# Patient Record
Sex: Female | Born: 1975
Health system: Southern US, Community
[De-identification: ages and names within clinical notes are randomized; demographics above are authoritative.]

## PROBLEM LIST (undated history)

## (undated) DIAGNOSIS — F410 Panic disorder [episodic paroxysmal anxiety] without agoraphobia: Secondary | ICD-10-CM

## (undated) HISTORY — DX: Panic disorder (episodic paroxysmal anxiety): F41.0

---

## 1997-10-09 ENCOUNTER — Other Ambulatory Visit: Admission: RE | Admit: 1997-10-09 | Discharge: 1997-10-09 | Payer: Self-pay | Admitting: Obstetrics and Gynecology

## 1998-04-11 ENCOUNTER — Inpatient Hospital Stay (HOSPITAL_COMMUNITY): Admission: AD | Admit: 1998-04-11 | Discharge: 1998-04-16 | Payer: Self-pay | Admitting: Obstetrics and Gynecology

## 1998-05-14 ENCOUNTER — Other Ambulatory Visit: Admission: RE | Admit: 1998-05-14 | Discharge: 1998-05-14 | Payer: Self-pay | Admitting: Obstetrics and Gynecology

## 1999-04-14 DIAGNOSIS — F410 Panic disorder [episodic paroxysmal anxiety] without agoraphobia: Secondary | ICD-10-CM

## 1999-04-14 HISTORY — DX: Panic disorder (episodic paroxysmal anxiety): F41.0

## 1999-06-18 ENCOUNTER — Other Ambulatory Visit: Admission: RE | Admit: 1999-06-18 | Discharge: 1999-06-18 | Payer: Self-pay | Admitting: Obstetrics and Gynecology

## 2000-06-30 ENCOUNTER — Other Ambulatory Visit: Admission: RE | Admit: 2000-06-30 | Discharge: 2000-06-30 | Payer: Self-pay | Admitting: Obstetrics and Gynecology

## 2001-01-26 ENCOUNTER — Other Ambulatory Visit: Admission: RE | Admit: 2001-01-26 | Discharge: 2001-01-26 | Payer: Self-pay | Admitting: Obstetrics and Gynecology

## 2001-09-02 ENCOUNTER — Inpatient Hospital Stay (HOSPITAL_COMMUNITY): Admission: AD | Admit: 2001-09-02 | Discharge: 2001-09-05 | Payer: Self-pay | Admitting: Obstetrics and Gynecology

## 2001-10-05 ENCOUNTER — Other Ambulatory Visit: Admission: RE | Admit: 2001-10-05 | Discharge: 2001-10-05 | Payer: Self-pay | Admitting: Obstetrics and Gynecology

## 2002-10-09 ENCOUNTER — Other Ambulatory Visit: Admission: RE | Admit: 2002-10-09 | Discharge: 2002-10-09 | Payer: Self-pay | Admitting: Obstetrics and Gynecology

## 2006-04-13 HISTORY — PX: CHOLECYSTECTOMY: SHX55

## 2008-06-11 ENCOUNTER — Ambulatory Visit: Payer: Self-pay | Admitting: Internal Medicine

## 2008-07-09 ENCOUNTER — Ambulatory Visit: Payer: Self-pay | Admitting: General Surgery

## 2008-07-26 ENCOUNTER — Ambulatory Visit: Payer: Self-pay | Admitting: General Surgery

## 2012-09-28 ENCOUNTER — Ambulatory Visit: Payer: Self-pay | Admitting: Internal Medicine

## 2012-12-01 ENCOUNTER — Ambulatory Visit (INDEPENDENT_AMBULATORY_CARE_PROVIDER_SITE_OTHER): Payer: 59 | Admitting: Internal Medicine

## 2012-12-01 ENCOUNTER — Encounter: Payer: Self-pay | Admitting: Internal Medicine

## 2012-12-01 VITALS — BP 152/90 | HR 77 | Temp 97.8°F | Resp 14 | Ht 66.25 in | Wt 180.8 lb

## 2012-12-01 DIAGNOSIS — IMO0001 Reserved for inherently not codable concepts without codable children: Secondary | ICD-10-CM

## 2012-12-01 DIAGNOSIS — I10 Essential (primary) hypertension: Secondary | ICD-10-CM | POA: Insufficient documentation

## 2012-12-01 DIAGNOSIS — F4310 Post-traumatic stress disorder, unspecified: Secondary | ICD-10-CM

## 2012-12-01 DIAGNOSIS — Z6825 Body mass index (BMI) 25.0-25.9, adult: Secondary | ICD-10-CM

## 2012-12-01 DIAGNOSIS — Z789 Other specified health status: Secondary | ICD-10-CM

## 2012-12-01 DIAGNOSIS — E669 Obesity, unspecified: Secondary | ICD-10-CM | POA: Insufficient documentation

## 2012-12-01 DIAGNOSIS — E663 Overweight: Secondary | ICD-10-CM

## 2012-12-01 DIAGNOSIS — F411 Generalized anxiety disorder: Secondary | ICD-10-CM

## 2012-12-01 DIAGNOSIS — R030 Elevated blood-pressure reading, without diagnosis of hypertension: Secondary | ICD-10-CM | POA: Insufficient documentation

## 2012-12-01 MED ORDER — ALPRAZOLAM 0.25 MG PO TABS: 0.2500 mg | ORAL_TABLET | Freq: Every evening | ORAL | Status: DC | PRN

## 2012-12-01 MED ORDER — PAROXETINE HCL 20 MG PO TABS: 20.0000 mg | ORAL_TABLET | Freq: Every day | ORAL | Status: DC

## 2012-12-01 NOTE — Progress Notes (Signed)
Patient ID: Christine Monroe, female   DOB: 08/29/1975, 37 y.o.   MRN: [id number]   Patient Active Problem List   Diagnosis Date Noted  . No history of unexplained stillbirth, greater than 3 spontaneous abortions, premature delivery with preeclampsia, or intrauterine growth retarted (IUGR) infant 12/01/2012  . Generalized anxiety disorder 12/01/2012  . Overweight (BMI 25.0-29.9) 12/01/2012    Subjective:  CC:   Chief Complaint  Patient presents with  . Establish Care    HPI:   Christine Monroe is a 37 y.o. female who presents as a new patient to establish primary care with the chief complaint of anxiety . She is transferring care from [facility].  Her anxiety has been aggravated by the recent initiation of phentermine which was prescribed  by her gynecologist Dr.  Rosemary Holms during her annual exam..  She has a history of generalized anxiety with panic disorder which started in 09-11-1999 when her mother died collapsed at work and die of a brain aneurysm.  She has not been on SSRI therapy for over a year. She recently started taking Paxil again after she developed symptoms of uncontrolled anxiety. She has been using alprazolam prn for panic attacks.  Overweight:  She has had recent  fasting labs done at work  (works for [employer] in [employer] )  .   PMH:no  prior hospitalizations.  She is s/p cholecystectomy in Sep 11, 2006 by Adela Glimpse with postoperative diarrhea which has resolved.  No bowel issues.  No insomnia.   her menses are regular in length and interval. She does not use birth control because her husband has had a vasectomy     Past Medical History  Diagnosis Date  . Panic attacks 11-Sep-1999    since mother death    Past Surgical History  Procedure Laterality Date  . Cholecystectomy  Sep 11, 2006    Family History  Problem Relation Age of Onset  . Cancer Father   . Hypertension Father   . Aneurysm Mother 60    2001    History   Social History  . Marital Status: Married    Spouse Name: N/A   Number of Children: N/A  . Years of Education: N/A   Occupational History  . Not on file.   Social History Main Topics  . Smoking status: Never Smoker   . Smokeless tobacco: Never Used  . Alcohol Use: No  . Drug Use: No  . Sexual Activity: Yes   Other Topics Concern  . Not on file   Social History Narrative  . No narrative on file     No Known Allergies   Review of Systems:   The remainder of the review of systems was negative except those addressed in the HPI.       Objective:  BP 152/90  Pulse 77  Temp(Src) 97.8 F (36.6 C) (Oral)  Resp 14  Ht 5' 6.25" (1.683 m)  Wt 180 lb 12 oz (81.988 kg)  BMI 28.95 kg/m2  SpO2 98%  LMP 11/13/2012  General appearance: alert, cooperative and appears stated age Ears: normal TM's and external ear canals both ears Throat: lips, mucosa, and tongue normal; teeth and gums normal Neck: no adenopathy, no carotid bruit, supple, symmetrical, trachea midline and thyroid not enlarged, symmetric, no tenderness/mass/nodules Back: symmetric, no curvature. ROM normal. No CVA tenderness. Lungs: clear to auscultation bilaterally Heart: regular rate and rhythm, S1, S2 normal, no murmur, click, rub or gallop Abdomen: soft, non-tender; bowel sounds normal; no masses,  no  organomegaly Pulses: 2+ and symmetric Skin: Skin color, texture, turgor normal. No rashes or lesions Lymph nodes: Cervical, supraclavicular, and axillary nodes normal.  Assessment and Plan:  Generalized anxiety disorder Resume Paxil at 20 mg daily. Suggested that she suspend phentermine until her anxiety is under better control. Continue when necessary alprazolam.  No history of unexplained stillbirth, greater than 3 spontaneous abortions, premature delivery with preeclampsia, or intrauterine growth retarted (IUGR) infant Her blood pressure is elevated today and she has a history of preeclampsia with hypertension only during her last pregnancy. I've asked her to get her  blood pressure checked several times over the next 2 weeks before resuming phentermine.  Overweight (BMI 25.0-29.9) I have addressed  BMI and recommended a low glycemic index diet utilizing smaller more frequent meals to increase metabolism.  I have also recommended that patient start exercising with a goal of 30 minutes of aerobic exercise a minimum of 5 days per week. Screening for lipid disorders, thyroid and diabetes has been done and records are pending.      Updated Medication List Outpatient Encounter Prescriptions as of 12/01/2012  Medication Sig Dispense Refill  . PARoxetine (PAXIL) 20 MG tablet Take 1 tablet (20 mg total) by mouth daily.  90 tablet  1  . [DISCONTINUED] PARoxetine (PAXIL) 20 MG tablet Take 1 tablet by mouth daily.      Marland Kitchen ALPRAZolam (XANAX) 0.25 MG tablet Take 1 tablet (0.25 mg total) by mouth at bedtime as needed for sleep.  30 tablet  2  . phentermine (ADIPEX-P) 37.5 MG tablet Take 37.5 mg by mouth daily before breakfast.       No facility-administered encounter medications on file as of 12/01/2012.     Orders Placed This Encounter  Procedures  . HM PAP SMEAR    No Follow-up on file.

## 2012-12-01 NOTE — Patient Instructions (Addendum)
It was a pleasure meeting you today!  I have sent a 90 day ref[facility]  I would delay starting the phentermine until your blood pressure is rechecked (normal is < 140/80)  Remember that phentermine CAN raise your  Blood pressure and increased your anxiety bc it is a stimulant  It is also addictive, so try not to use it more than 3 months at a time.  Please ask [facility] to send me your recent bloodwork  This is my version of a  "Low GI"  Diet:  It will  allow you to lose 4 to 8  lbs  per month if you follow it carefully.  Your goal with exercise is a minimum of 30 minutes of aerobic exercise 5 days per week (Walking does not count once it becomes easy!)    All of the foods can be found at grocery stores and in bulk at [employer].  The Atkins protein bars and shakes are available in more varieties at Target, WalMart and [employer].     7 AM Breakfast:  Choose from the following:  Low carbohydrate Protein  Shakes (I recommend the [employer]AS AdvantEdge "Carb Control" shakes  Or the low carb shakes by Atkins.    2.5 carbs   Arnold's "Sandwhich Thin"toasted  w/ peanut butter (no jelly: about 20 net carbs  "Bagel Thin" with cream cheese and salmon: about 20 carbs   a scrambled egg/bacon/cheese burrito made with Mission's "carb balance" whole wheat tortilla  (about 10 net carbs )   Avoid cereal and bananas, oatmeal and cream of wheat and grits. They are loaded with carbohydrates!   10 AM: high protein snack  Protein bar by Atkins (the snack size, under 200 cal, usually < 6 net carbs).    A stick of cheese:  Around 1 carb,  100 cal     Dannon Light n Fit [place] Yogurt  (80 cal, 8 carbs)  Other so called "protein bars" and Greek yogurts tend to be loaded with carbohydrates.  Remember, in food advertising, the word "energy" is synonymous for " carbohydrate."  Lunch:   A Sandwich using the bread choices listed, Can use any  Eggs,  lunchmeat, grilled meat or canned tuna), avocado, regular  mayo/mustard  and cheese.  A Salad using blue cheese, ranch,  Goddess or vinagrette,  No croutons or "confetti" and no "candied nuts" but regular nuts OK.   No pretzels or chips.  Pickles and miniature sweet peppers are a good low carb alternative that provide a "crunch"  The bread is the only source of carbohydrate in a sandwich and  can be decreased by trying some of these alternatives to traditional loaf bread  Joseph's makes a pita bread and a flat bread that are 50 cal and 4 net carbs available at BJs and WalMart.  This can be toasted to use with hummous as well  Toufayan makes a low carb flatbread that's 100 cal and 9 net carbs available at [employer] and [employer] makes 2 sizes of  Low carb whole wheat tortilla  (The large one is 210 cal and 6 net carbs) Avoid "Low fat dressings, as well as Reyne Dumas and [address] dressings They are loaded with sugar!   3 PM/ Mid day  Snack:  Consider  1 ounce of  almonds, walnuts, pistachios, pecans, peanuts,  Macadamia nuts or a nut medley.  Avoid "granola"; the dried cranberries and raisins are loaded with carbohydrates.  Mixed nuts as long as there are no raisins,  cranberries or dried fruit.     6 PM  Dinner:     Meat/fowl/fish with a green salad, and either broccoli, cauliflower, green beans, spinach, brussel sprouts or  Lima beans. DO NOT BREAD THE PROTEIN!!      There is a low carb pasta by Dreamfield's that is acceptable and tastes great: only 5 digestible carbs/serving.( All grocery stores but BJs carry it )  Try Kai Levins Angelo's chicken piccata or chicken or eggplant parm over low carb pasta.(Lowes and BJs)   Clifton Custard Sanchez's "Carnitas" (pulled pork, no sauce,  0 carbs) or his beef pot roast to make a dinner burrito (at BJ's)  Pesto over low carb pasta (bj's sells a good quality pesto in the center refrigerated section of the deli   Whole wheat pasta is still full of digestible carbs and  Not as low in glycemic index as Dreamfield's.    Brown rice is still rice,  So skip the rice and noodles if you eat [place] or [place] (or at least limit to 1/2 cup)  9 PM snack :   Breyer's "low carb" fudgsicle or  ice cream bar (Carb Smart line), or  Weight Watcher's ice cream bar , or another "no sugar added" ice cream;  a serving of fresh berries/cherries with whipped cream   Cheese or DANNON'S LlGHT N FIT [place] YOGURT  Avoid bananas, pineapple, grapes  and watermelon on a regular basis because they are high in sugar.  THINK OF THEM AS DESSERT  Remember that snack Substitutions should be less than 10 NET carbs per serving and meals < 20 carbs. Remember to subtract fiber grams to get the "net carbs."

## 2012-12-04 ENCOUNTER — Encounter: Payer: Self-pay | Admitting: Internal Medicine

## 2012-12-04 NOTE — Assessment & Plan Note (Signed)
I have addressed  BMI and recommended a low glycemic index diet utilizing smaller more frequent meals to increase metabolism.  I have also recommended that patient start exercising with a goal of 30 minutes of aerobic exercise a minimum of 5 days per week. Screening for lipid disorders, thyroid and diabetes has been done and records are pending.

## 2012-12-04 NOTE — Assessment & Plan Note (Signed)
Resume Paxil at 20 mg daily. Suggested that she suspend phentermine until her anxiety is under better control. Continue when necessary alprazolam.

## 2012-12-04 NOTE — Assessment & Plan Note (Signed)
Her blood pressure is elevated today and she has a history of preeclampsia with hypertension only during her last pregnancy. I've asked her to get her blood pressure checked several times over the next 2 weeks before resuming phentermine.

## 2013-02-01 ENCOUNTER — Other Ambulatory Visit: Payer: Self-pay | Admitting: *Deleted

## 2013-02-01 DIAGNOSIS — F4310 Post-traumatic stress disorder, unspecified: Secondary | ICD-10-CM

## 2013-02-01 MED ORDER — PAROXETINE HCL 20 MG PO TABS: 20.0000 mg | ORAL_TABLET | Freq: Every day | ORAL | Status: DC

## 2013-02-03 ENCOUNTER — Telehealth: Payer: Self-pay | Admitting: *Deleted

## 2013-02-22 ENCOUNTER — Telehealth: Payer: Self-pay | Admitting: Internal Medicine

## 2013-02-22 DIAGNOSIS — F4310 Post-traumatic stress disorder, unspecified: Secondary | ICD-10-CM

## 2013-02-22 MED ORDER — PAROXETINE HCL 20 MG PO TABS: 20.0000 mg | ORAL_TABLET | Freq: Every day | ORAL | Status: DC

## 2013-02-22 NOTE — Telephone Encounter (Signed)
Spoke with pt, needing refill on P[facility] to [facility]. Advised to contact pharmacy first for future refills. Rx sent to pharmacy by escript

## 2013-02-22 NOTE — Telephone Encounter (Signed)
Pt left vm. States a prescription is going to run out.  No med name/details given.  Asking for a call.

## 2013-02-28 ENCOUNTER — Other Ambulatory Visit: Payer: Self-pay | Admitting: *Deleted

## 2013-02-28 DIAGNOSIS — F4310 Post-traumatic stress disorder, unspecified: Secondary | ICD-10-CM

## 2013-02-28 MED ORDER — PAROXETINE HCL 20 MG PO TABS: 20.0000 mg | ORAL_TABLET | Freq: Every day | ORAL | Status: DC

## 2013-03-22 NOTE — Telephone Encounter (Signed)
error 

## 2013-04-03 ENCOUNTER — Other Ambulatory Visit: Payer: Self-pay | Admitting: *Deleted

## 2013-04-03 DIAGNOSIS — F4310 Post-traumatic stress disorder, unspecified: Secondary | ICD-10-CM

## 2013-04-03 MED ORDER — PAROXETINE HCL 20 MG PO TABS: 20.0000 mg | ORAL_TABLET | Freq: Every day | ORAL | Status: DC

## 2013-06-05 ENCOUNTER — Other Ambulatory Visit: Payer: Self-pay | Admitting: *Deleted

## 2013-06-05 DIAGNOSIS — F4310 Post-traumatic stress disorder, unspecified: Secondary | ICD-10-CM

## 2013-06-05 MED ORDER — PAROXETINE HCL 20 MG PO TABS: 20.0000 mg | ORAL_TABLET | Freq: Every day | ORAL | Status: DC

## 2013-06-05 NOTE — Telephone Encounter (Signed)
Appt sch 06/09/13

## 2013-06-06 ENCOUNTER — Ambulatory Visit: Payer: 59 | Admitting: Internal Medicine

## 2013-06-09 ENCOUNTER — Ambulatory Visit: Payer: 59 | Admitting: Internal Medicine

## 2013-06-23 ENCOUNTER — Telehealth: Payer: Self-pay | Admitting: *Deleted

## 2013-06-23 ENCOUNTER — Encounter: Payer: Self-pay | Admitting: *Deleted

## 2013-06-26 ENCOUNTER — Ambulatory Visit (INDEPENDENT_AMBULATORY_CARE_PROVIDER_SITE_OTHER): Payer: Managed Care, Other (non HMO) | Admitting: Internal Medicine

## 2013-06-26 ENCOUNTER — Encounter (INDEPENDENT_AMBULATORY_CARE_PROVIDER_SITE_OTHER): Payer: Self-pay

## 2013-06-26 ENCOUNTER — Encounter: Payer: Self-pay | Admitting: Internal Medicine

## 2013-06-26 VITALS — BP 126/82 | HR 81 | Temp 98.3°F | Resp 16 | Wt 187.5 lb

## 2013-06-26 DIAGNOSIS — F411 Generalized anxiety disorder: Secondary | ICD-10-CM

## 2013-06-26 DIAGNOSIS — M7710 Lateral epicondylitis, unspecified elbow: Secondary | ICD-10-CM

## 2013-06-26 DIAGNOSIS — R030 Elevated blood-pressure reading, without diagnosis of hypertension: Secondary | ICD-10-CM

## 2013-06-26 DIAGNOSIS — E669 Obesity, unspecified: Secondary | ICD-10-CM

## 2013-06-26 DIAGNOSIS — M7711 Lateral epicondylitis, right elbow: Secondary | ICD-10-CM | POA: Insufficient documentation

## 2013-06-26 MED ORDER — ESOMEPRAZOLE MAGNESIUM 20 MG PO CPDR: 20.0000 mg | DELAYED_RELEASE_CAPSULE | Freq: Every day | ORAL | Status: DC

## 2013-06-26 MED ORDER — LORCASERIN HCL 10 MG PO TABS: 1.0000 | ORAL_TABLET | Freq: Two times a day (BID) | ORAL | Status: DC

## 2013-06-26 MED ORDER — IBUPROFEN 800 MG PO TABS: 800.0000 mg | ORAL_TABLET | Freq: Three times a day (TID) | ORAL | Status: DC | PRN

## 2013-06-26 NOTE — Patient Instructions (Signed)
Lateral Epicondylitis (Tennis Elbow) with Rehab Lateral epicondylitis involves inflammation and pain around the outer portion of the elbow. The pain is caused by inflammation of the tendons in the forearm that bring back (extend) the wrist. Lateral epicondylittis is also called tennis elbow, because it is very common in tennis players. However, it may occur in any individual who extends the wrist repetitively. If lateral epicondylitis is left untreated, it may become a chronic problem. SYMPTOMS   Pain, tenderness, and inflammation on the outer (lateral) side of the elbow.  Pain or weakness with gripping activities.  Pain that increases with wrist twisting motions (playing tennis, using a screwdriver, opening a door or a jar).  Pain with lifting objects, including a coffee cup. CAUSES  Lateral epicondylitis is caused by inflammation of the tendons that extend the wrist. Causes of injury may include:  Repetitive stress and strain on the muscles and tendons that extend the wrist.  Sudden change in activity level or intensity.  Incorrect grip in racquet sports.  Incorrect grip size of racquet (often too large).  Incorrect hitting position or technique (usually backhand, leading with the elbow).  Using a racket that is too heavy. RISK INCREASES WITH:  Sports or occupations that require repetitive and/or strenuous forearm and wrist movements (tennis, squash, racquetball, carpentry).  Poor wrist and forearm strength and flexibility.  Failure to warm up properly before activity.  Resuming activity before healing, rehabilitation, and conditioning are complete. PREVENTION   Warm up and stretch properly before activity.  Maintain physical fitness:  Strength, flexibility, and endurance.  Cardiovascular fitness.  Wear and use properly fitted equipment.  Learn and use proper technique and have a coach correct improper technique.  Wear a tennis elbow (counterforce) brace. PROGNOSIS   The course of this condition depends on the degree of the injury. If treated properly, acute cases (symptoms lasting less than 4 weeks) are often resolved in 2 to 6 weeks. Chronic (longer lasting cases) often resolve in 3 to 6 months, but may require physical therapy. RELATED COMPLICATIONS   Frequently recurring symptoms, resulting in a chronic problem. Properly treating the problem the first time decreases frequency of recurrence.  Chronic inflammation, scarring tendon degeneration, and partial tendon tear, requiring surgery.  Delayed healing or resolution of symptoms. TREATMENT  Treatment first involves the use of ice and medicine, to reduce pain and inflammation. Strengthening and stretching exercises may help reduce discomfort, if performed regularly. These exercises may be performed at home, if the condition is an acute injury. Chronic cases may require a referral to a physical therapist for evaluation and treatment. Your caregiver may advise a corticosteroid injection, to help reduce inflammation. Rarely, surgery is needed. MEDICATION  If pain medicine is needed, nonsteroidal anti-inflammatory medicines (aspirin and ibuprofen), or other minor pain relievers (acetaminophen), are often advised.  Do not take pain medicine for 7 days before surgery.  Prescription pain relievers may be given, if your caregiver thinks they are needed. Use only as directed and only as much as you need.  Corticosteroid injections may be recommended. These injections should be reserved only for the most severe cases, because they can only be given a certain number of times. HEAT AND COLD  Cold treatment (icing) should be applied for 10 to 15 minutes every 2 to 3 hours for inflammation and pain, and immediately after activity that aggravates your symptoms. Use ice packs or an ice massage.  Heat treatment may be used before performing stretching and strengthening activities prescribed by your  caregiver, physical  therapist, or athletic trainer. Use a heat pack or a warm water soak. SEEK MEDICAL CARE IF: Symptoms get worse or do not improve in 2 weeks, despite treatment. EXERCISES  RANGE OF MOTION (ROM) AND STRETCHING EXERCISES - Epicondylitis, Lateral (Tennis Elbow) These exercises may help you when beginning to rehabilitate your injury. Your symptoms may go away with or without further involvement from your physician, physical therapist or athletic trainer. While completing these exercises, remember:   Restoring tissue flexibility helps normal motion to return to the joints. This allows healthier, less painful movement and activity.  An effective stretch should be held for at least 30 seconds.  A stretch should never be painful. You should only feel a gentle lengthening or release in the stretched tissue. RANGE OF MOTION  Wrist Flexion, Active-Assisted  Extend your right / left elbow with your fingers pointing down.*  Gently pull the back of your hand towards you, until you feel a gentle stretch on the top of your forearm.  Hold this position for __________ seconds. Repeat __________ times. Complete this exercise __________ times per day.  *If directed by your physician, physical therapist or athletic trainer, complete this stretch with your elbow bent, rather than extended. RANGE OF MOTION  Wrist Extension, Active-Assisted  Extend your right / left elbow and turn your palm upwards.*  Gently pull your palm and fingertips back, so your wrist extends and your fingers point more toward the ground.  You should feel a gentle stretch on the inside of your forearm.  Hold this position for __________ seconds. Repeat __________ times. Complete this exercise __________ times per day. *If directed by your physician, physical therapist or athletic trainer, complete this stretch with your elbow bent, rather than extended. STRETCH - Wrist Flexion  Place the back of your right / left hand on a tabletop,  leaving your elbow slightly bent. Your fingers should point away from your body.  Gently press the back of your hand down onto the table by straightening your elbow. You should feel a stretch on the top of your forearm.  Hold this position for __________ seconds. Repeat __________ times. Complete this stretch __________ times per day.  STRETCH  Wrist Extension   Place your right / left fingertips on a tabletop, leaving your elbow slightly bent. Your fingers should point backwards.  Gently press your fingers and palm down onto the table by straightening your elbow. You should feel a stretch on the inside of your forearm.  Hold this position for __________ seconds. Repeat __________ times. Complete this stretch __________ times per day.  STRENGTHENING EXERCISES - Epicondylitis, Lateral (Tennis Elbow) These exercises may help you when beginning to rehabilitate your injury. They may resolve your symptoms with or without further involvement from your physician, physical therapist or athletic trainer. While completing these exercises, remember:   Muscles can gain both the endurance and the strength needed for everyday activities through controlled exercises.  Complete these exercises as instructed by your physician, physical therapist or athletic trainer. Increase the resistance and repetitions only as guided.  You may experience muscle soreness or fatigue, but the pain or discomfort you are trying to eliminate should never worsen during these exercises. If this pain does get worse, stop and make sure you are following the directions exactly. If the pain is still present after adjustments, discontinue the exercise until you can discuss the trouble with your caregiver. STRENGTH Wrist Flexors  Sit with your right / left forearm palm-up and  fully supported on a table or countertop. Your elbow should be resting below the height of your shoulder. Allow your wrist to extend over the edge of the  surface.  Loosely holding a __________ weight, or a piece of rubber exercise band or tubing, slowly curl your hand up toward your forearm.  Hold this position for __________ seconds. Slowly lower the wrist back to the starting position in a controlled manner. Repeat __________ times. Complete this exercise __________ times per day.  STRENGTH  Wrist Extensors  Sit with your right / left forearm palm-down and fully supported on a table or countertop. Your elbow should be resting below the height of your shoulder. Allow your wrist to extend over the edge of the surface.  Loosely holding a __________ weight, or a piece of rubber exercise band or tubing, slowly curl your hand up toward your forearm.  Hold this position for __________ seconds. Slowly lower the wrist back to the starting position in a controlled manner. Repeat __________ times. Complete this exercise __________ times per day.  STRENGTH - Ulnar Deviators  Stand with a ____________________ weight in your right / left hand, or sit while holding a rubber exercise band or tubing, with your healthy arm supported on a table or countertop.  Move your wrist, so that your pinkie travels toward your forearm and your thumb moves away from your forearm.  Hold this position for __________ seconds and then slowly lower the wrist back to the starting position. Repeat __________ times. Complete this exercise __________ times per day STRENGTH - Radial Deviators  Stand with a ____________________ weight in your right / left hand, or sit while holding a rubber exercise band or tubing, with your injured arm supported on a table or countertop.  Raise your hand upward in front of you or pull up on the rubber tubing.  Hold this position for __________ seconds and then slowly lower the wrist back to the starting position. Repeat __________ times. Complete this exercise __________ times per day. STRENGTH  Forearm Supinators   Sit with your right /  left forearm supported on a table, keeping your elbow below shoulder height. Rest your hand over the edge, palm down.  Gently grip a hammer or a soup ladle.  Without moving your elbow, slowly turn your palm and hand upward to a "thumbs-up" position.  Hold this position for __________ seconds. Slowly return to the starting position. Repeat __________ times. Complete this exercise __________ times per day.  STRENGTH  Forearm Pronators   Sit with your right / left forearm supported on a table, keeping your elbow below shoulder height. Rest your hand over the edge, palm up.  Gently grip a hammer or a soup ladle.  Without moving your elbow, slowly turn your palm and hand upward to a "thumbs-up" position.  Hold this position for __________ seconds. Slowly return to the starting position. Repeat __________ times. Complete this exercise __________ times per day.  STRENGTH - Grip  Grasp a tennis ball, a dense sponge, or a large, rolled sock in your hand.  Squeeze as hard as you can, without increasing any pain.  Hold this position for __________ seconds. Release your grip slowly. Repeat __________ times. Complete this exercise __________ times per day.  STRENGTH - Elbow Extensors, Isometric  Stand or sit upright, on a firm surface. Place your right / left arm so that your palm faces your stomach, and it is at the height of your waist.  Place your opposite hand on  the underside of your forearm. Gently push up as your right / left arm resists. Push as hard as you can with both arms, without causing any pain or movement at your right / left elbow. Hold this stationary position for __________ seconds. Gradually release the tension in both arms. Allow your muscles to relax completely before repeating. Document Released: 03/30/2005 Document Revised: 06/22/2011 Document Reviewed: 07/12/2008 [facility] Patient Information 2014 [place], [place].

## 2013-06-26 NOTE — Progress Notes (Signed)
Patient ID: Christine Monroe, female   DOB: [birth year], 38 y.o.   MRN: [id number]  Patient Active Problem List   Diagnosis Date Noted  . Lateral epicondylitis of right elbow 06/26/2013  . Elevated blood-pressure reading without diagnosis of hypertension 12/01/2012  . Generalized anxiety disorder 12/01/2012  . Obesity (BMI 30.0-34.9) 12/01/2012    Subjective:  CC:   Chief Complaint  Patient presents with  . Follow-up    6 month    HPI:   Christine Monroe is a 38 y.o. female who presents for Cc: right forearm pain for 3 weeks,  No progression. States that it hurts to supinate and extend her wrist. 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    Obesity;  She is frustrated with her inability to lose weight despite exercising and folowing Weight Watcher diet.  She feels hungry often.  She is requesting pharmacotherapy with  Belviq now that her anxiety is under better control.  Anxiety:  Her anxiety is better controlled with Paxil     Past Medical History  Diagnosis Date  . Panic attacks Jul 29, 1999    since mother death    Past Surgical History  Procedure Laterality Date  . Cholecystectomy  07/29/06       The following portions of the patient's history were reviewed and updated as appropriate: Allergies, current medications, and problem list.    Review of Systems:   Patient denies headache, fevers, malaise, unintentional weight loss, skin rash, eye pain, sinus congestion and sinus pain, sore throat, dysphagia,  hemoptysis , cough, dyspnea, wheezing, chest pain, palpitations, orthopnea, edema, abdominal pain, nausea, melena, diarrhea, constipation, flank pain, dysuria, hematuria, urinary  Frequency, nocturia, numbness, tingling, seizures,  Focal weakness, Loss of consciousness,  Tremor, insomnia, depression, anxiety, and suicidal ideation.     History   Social History  . Marital Status: Married    Spouse Name:  N/A    Number of Children: N/A  . Years of Education: N/A   Occupational History  . Not on file.   Social History Main Topics  . Smoking status: Never Smoker   . Smokeless tobacco: Never Used  . Alcohol Use: No  . Drug Use: No  . Sexual Activity: Yes   Other Topics Concern  . Not on file   Social History Narrative  . No narrative on file    Objective:  Filed Vitals:   06/26/13 0811  BP: 126/82  Pulse: 81  Temp: 98.3 F (36.8 C)  Resp: 16     General appearance: alert, cooperative and appears stated age Ears: normal TM's and external ear canals both ears Throat: lips, mucosa, and tongue normal; teeth and gums normal Neck: no adenopathy, no carotid bruit, supple, symmetrical, trachea midline and thyroid not enlarged, symmetric, no tenderness/mass/nodules Back: symmetric, no curvature. ROM normal. No CVA tenderness. Lungs: clear to auscultation bilaterally Heart: regular rate and rhythm, S1, S2 normal, no murmur, click, rub or gallop Abdomen: soft, non-tender; bowel sounds normal; no masses,  no organomegaly Pulses: 2+ and symmetric Skin: Skin color, texture, turgor normal. No rashes or lesions Lymph nodes: Cervical, supraclavicular, and axillary nodes normal.  Assessment and Plan:  Elevated blood-pressure reading without diagnosis of hypertension Normal today.  Generalized anxiety disorder Improved with paxil.  No changes today  Obesity (BMI 30.0-34.9) I have addressed  BMI and recommended wt loss of 10% of body weight or 19 lbs over the  next 3 months using a low glycemic index diet and regular exercise a minimum of 5 days per week. Trial of Belviq .    Lateral epicondylitis of right elbow NSAIDs, ice and activity modification for two weeks.    Updated Medication List Outpatient Encounter Prescriptions as of 06/26/2013  Medication Sig  . ALPRAZolam (XANAX) 0.25 MG tablet Take 1 tablet (0.25 mg total) by mouth at bedtime as needed for sleep.  Marland Kitchen PARoxetine  (PAXIL) 20 MG tablet Take 1 tablet (20 mg total) by mouth daily.  Marland Kitchen esomeprazole (NEXIUM) 20 MG capsule Take 1 capsule (20 mg total) by mouth daily at 12 noon.  Marland Kitchen ibuprofen (ADVIL,MOTRIN) 800 MG tablet Take 1 tablet (800 mg total) by mouth every 8 (eight) hours as needed.  . Lorcaserin HCl 10 MG TABS Take 1 tablet by mouth 2 (two) times daily.  . [DISCONTINUED] Lorcaserin HCl 10 MG TABS Take 1 tablet by mouth 2 (two) times daily.  . [DISCONTINUED] phentermine (ADIPEX-P) 37.5 MG tablet Take 37.5 mg by mouth daily before breakfast.     No orders of the defined types were placed in this encounter.    No Follow-up on file.

## 2013-06-26 NOTE — Assessment & Plan Note (Signed)
Normal today

## 2013-06-26 NOTE — Assessment & Plan Note (Addendum)
I have addressed  BMI and recommended wt loss of 10% of body weight or 19 lbs over the next 3 months using a low glycemic index diet and regular exercise a minimum of 5 days per week. Trial of Belviq .

## 2013-06-26 NOTE — Assessment & Plan Note (Signed)
NSAIDs, ice and activity modification for two weeks.

## 2013-06-26 NOTE — Assessment & Plan Note (Signed)
Improved with paxil.  No changes today

## 2013-07-03 ENCOUNTER — Other Ambulatory Visit: Payer: Self-pay | Admitting: Internal Medicine

## 2013-07-13 ENCOUNTER — Telehealth: Payer: Self-pay | Admitting: Internal Medicine

## 2013-07-13 MED ORDER — PENCICLOVIR 1 % EX CREA: 1.0000 "application " | TOPICAL_CREAM | CUTANEOUS | Status: DC

## 2013-07-13 NOTE — Telephone Encounter (Signed)
Pt.notified

## 2013-07-13 NOTE — Telephone Encounter (Signed)
Pt called stating that she gets fever blisters very often.  States she was recently told something could be prescribed to help with these and prevent them.  States she is leaving for [place] tomorrow and would like to have something if possible.  Advised pt appt may be needed.  Pt unable to come today, and no appt available.  States if she cannot get something before her trip she will make appt when she comes back from her trip.  Asking for a call.

## 2013-07-13 NOTE — Telephone Encounter (Signed)
I have sent an rx for denaivr cream apply every 2 hours until resolved.

## 2013-07-19 NOTE — Telephone Encounter (Signed)
error 

## 2013-07-31 ENCOUNTER — Other Ambulatory Visit: Payer: Self-pay | Admitting: Internal Medicine

## 2013-07-31 NOTE — Telephone Encounter (Signed)
Electronic Rx request, please advise. 

## 2013-08-02 NOTE — Telephone Encounter (Signed)
Script faxed.

## 2013-09-06 ENCOUNTER — Encounter: Payer: Self-pay | Admitting: Adult Health

## 2013-09-06 ENCOUNTER — Ambulatory Visit: Payer: Self-pay | Admitting: Adult Health

## 2013-09-06 ENCOUNTER — Ambulatory Visit (INDEPENDENT_AMBULATORY_CARE_PROVIDER_SITE_OTHER): Payer: Managed Care, Other (non HMO) | Admitting: Adult Health

## 2013-09-06 VITALS — BP 108/70 | HR 73 | Temp 98.3°F | Resp 14 | Wt 184.8 lb

## 2013-09-06 DIAGNOSIS — M25569 Pain in unspecified knee: Secondary | ICD-10-CM

## 2013-09-06 DIAGNOSIS — L237 Allergic contact dermatitis due to plants, except food: Secondary | ICD-10-CM

## 2013-09-06 DIAGNOSIS — M25561 Pain in right knee: Secondary | ICD-10-CM | POA: Insufficient documentation

## 2013-09-06 DIAGNOSIS — L255 Unspecified contact dermatitis due to plants, except food: Secondary | ICD-10-CM

## 2013-09-06 IMAGING — CR DG KNEE COMPLETE 4+V*R*
1 series · 4 of 4 positions shown · non-contrast
Comparison: None.

CLINICAL DATA: Increase pain with increasing exercise

EXAM:
RIGHT KNEE - COMPLETE 4+ VIEW

[Series 1: t knee ap right · 0.14mm/px · 4 of 4 slices shown]
[im 1/4]
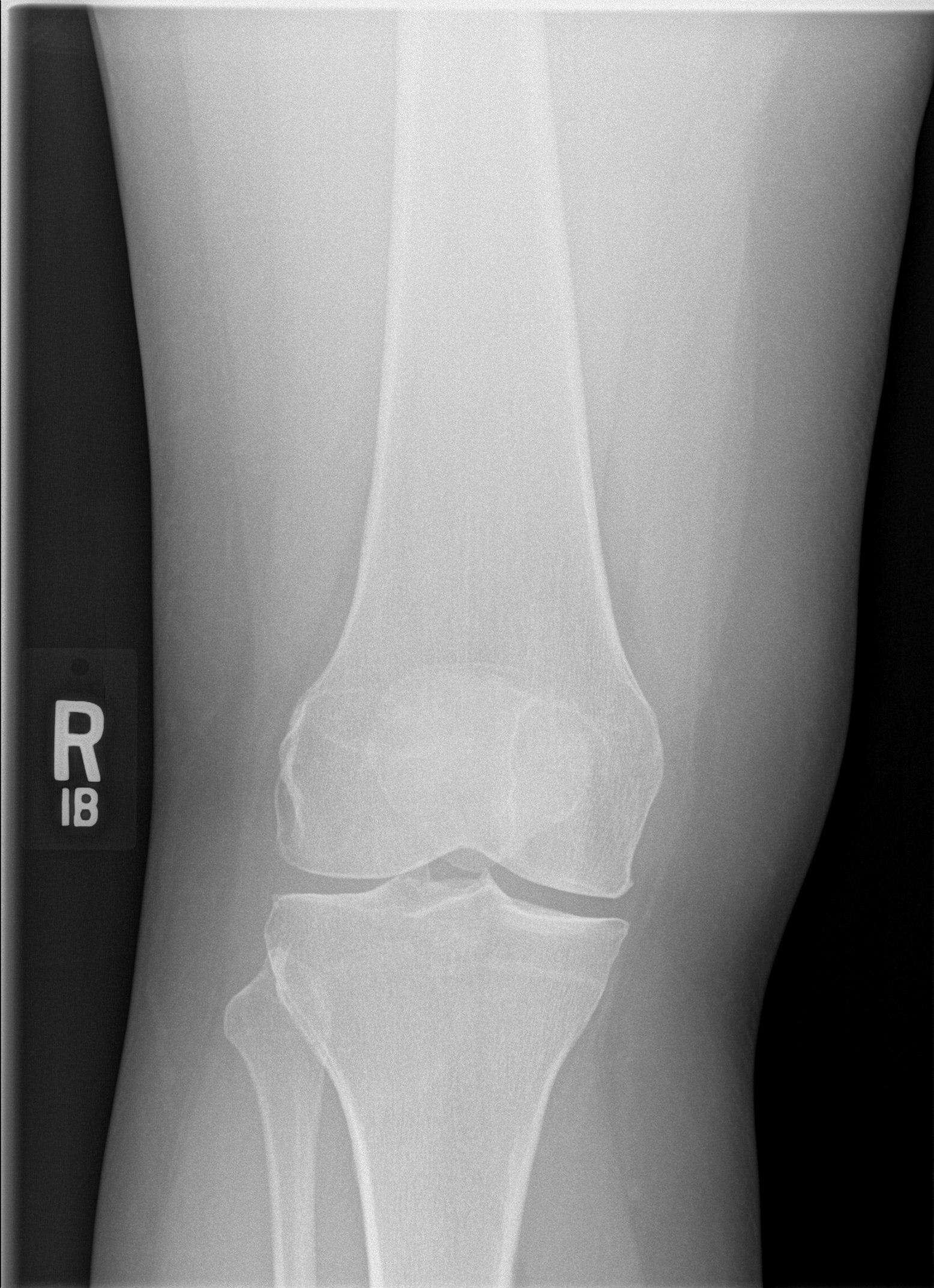
[im 2/4]
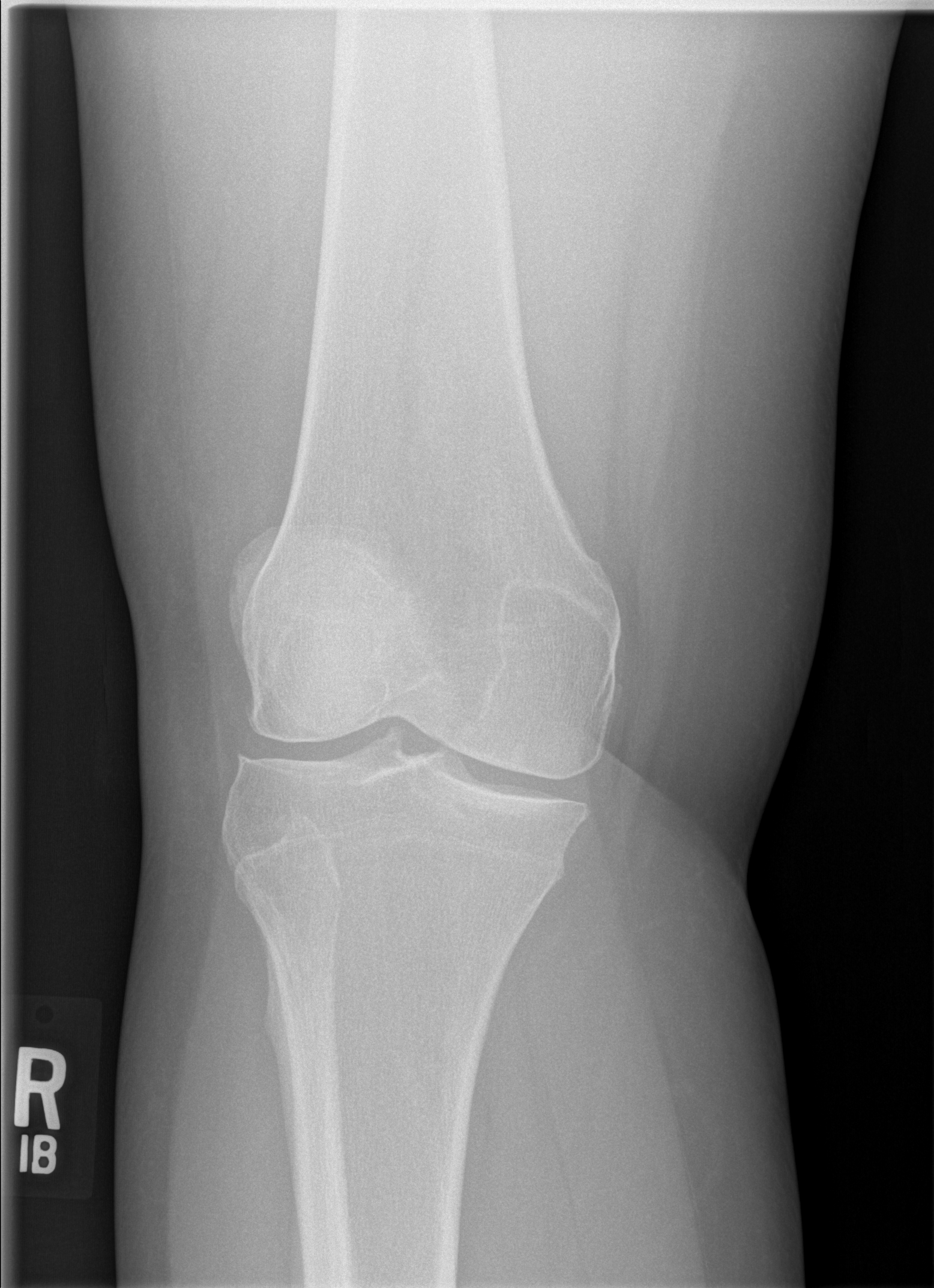
[im 3/4]
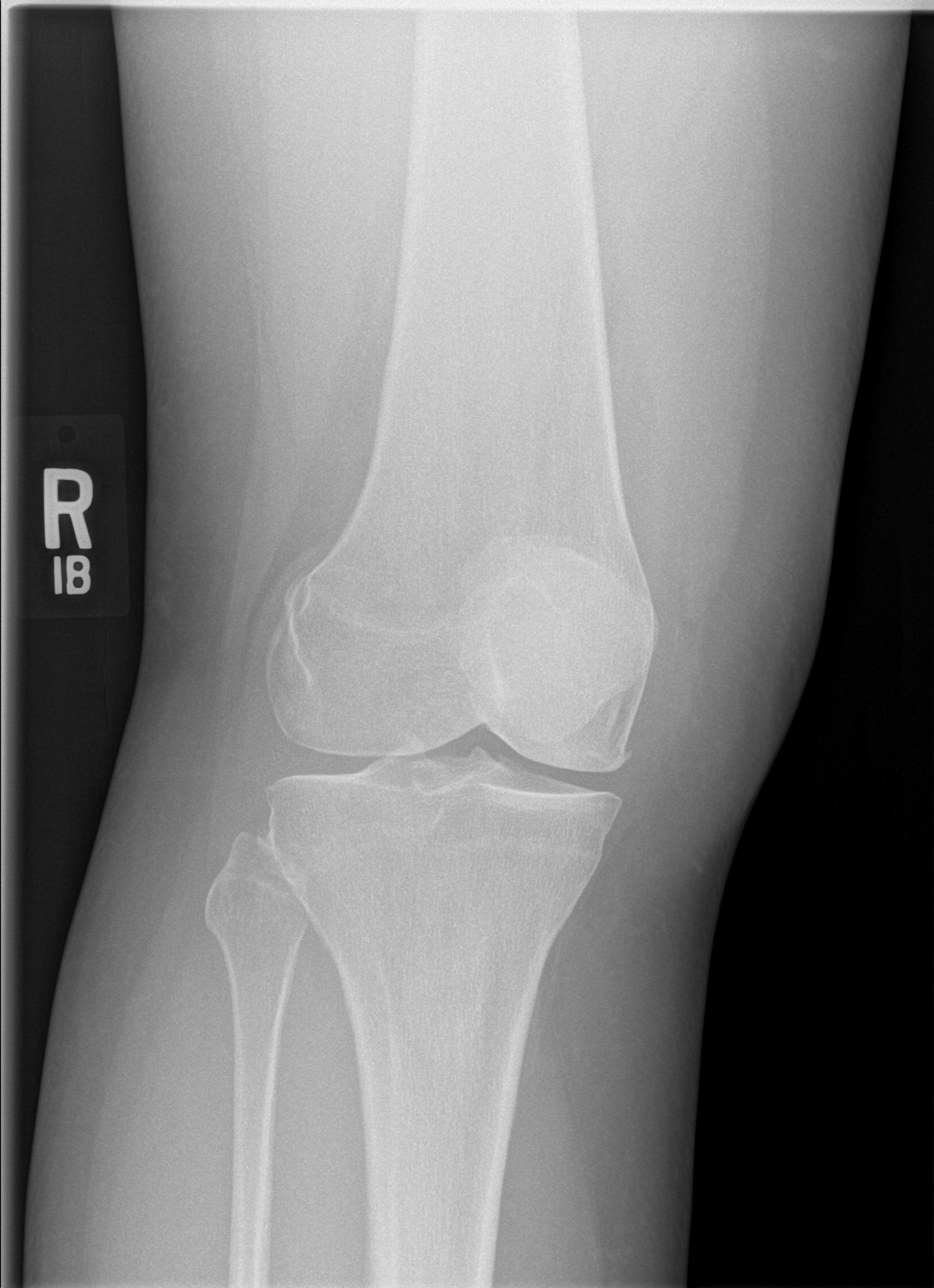
[im 4/4]
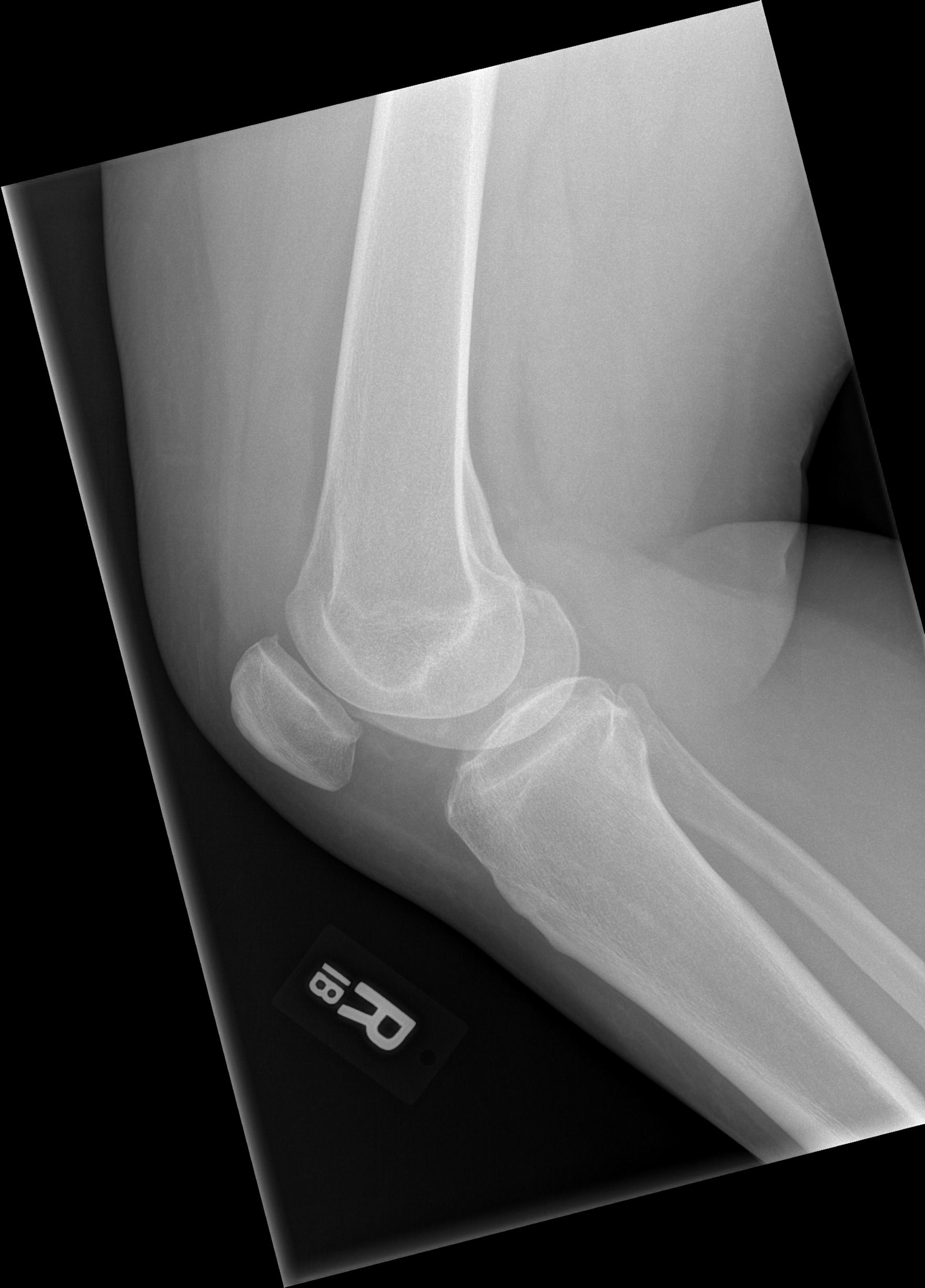

[4 of 4 positions shown; findings below may reference images not displayed]

FINDINGS: There is very minimal degenerative spurring from all three
compartments. However on the lateral view there does appear to be a
small right knee joint effusion present. No fracture is seen.
IMPRESSION: Small right knee joint effusion. Very mild tricompartmental
degenerative joint disease.

## 2013-09-06 MED ORDER — PREDNISONE 10 MG PO TABS: ORAL_TABLET | ORAL | Status: DC

## 2013-09-06 NOTE — Progress Notes (Signed)
Patient ID: Christine Monroe, female   DOB: 12/01/1975, 38 y.o.   MRN: [id number]    Subjective:    Patient ID: Christine Monroe, female    DOB: 01-05-1976, 38 y.o.   MRN: [id number]  HPI  Pt is a pleasant 38 y/o female who presents to clinic with the following concerns:  1) Right knee pain and swelling x 1 week. She is usually very active but has increased her activity further in the past week. She participates in [employer] and she has recently started running. She has been taking some ibuprofen.   2) Poison oak. She has multiple blisters behind her right knee, on bilateral legs, abdomen, arms and neck. She has been using an OTC solution but does not know what the name of it is. Very itchy.   Past Medical History  Diagnosis Date  . Panic attacks 11-Aug-1999    since mother death    Current Outpatient Prescriptions on File Prior to Visit  Medication Sig Dispense Refill  . ALPRAZolam (XANAX) 0.25 MG tablet Take 1 tablet (0.25 mg total) by mouth at bedtime as needed for sleep.  30 tablet  2  . BELVIQ 10 MG TABS TAKE 1 TABLET BY MOUTH TWICE A DAY  28 tablet  0  . esomeprazole (NEXIUM) 20 MG capsule Take 1 capsule (20 mg total) by mouth daily at 12 noon.  14 capsule  0  . ibuprofen (ADVIL,MOTRIN) 800 MG tablet Take 1 tablet (800 mg total) by mouth every 8 (eight) hours as needed.  60 tablet  0  . PARoxetine (PAXIL) 20 MG tablet TAKE 1 TABLET BY MOUTH EVERY DAY  30 tablet  2   No current facility-administered medications on file prior to visit.     Review of Systems  Constitutional: Negative.   Musculoskeletal: Positive for arthralgias and joint swelling.  Skin: Positive for rash (poison oak).  All other systems reviewed and are negative.      Objective:  BP 108/70  Pulse 73  Temp(Src) 98.3 F (36.8 C) (Oral)  Resp 14  Wt 184 lb 12 oz (83.802 kg)  SpO2 98%   Physical Exam  Constitutional: She is oriented to person, place, and time. No distress.  HENT:  Head: Normocephalic and atraumatic.    Eyes: Conjunctivae and EOM are normal.  Neck: Normal range of motion. Neck supple.  Cardiovascular: Normal rate and regular rhythm.   Pulmonary/Chest: Effort normal. No respiratory distress.  Musculoskeletal: She exhibits edema and tenderness.  Right knee crepitus, edema. No tenderness with palpation  Neurological: She is alert and oriented to person, place, and time. She has normal reflexes.  Skin: Skin is warm and dry.  Psychiatric: She has a normal mood and affect. Her behavior is normal. Judgment and thought content normal.      Assessment & Plan:   1. Pain in right knee Rest, ice, heat, immobilize with knee brace. Send for xray. Consider referral to ortho. Waiting for results. - DG Knee Complete 4 Views Right; Future  2. Poison oak dermatitis Start cortizone 10 and prednisone taper. - predniSONE (DELTASONE) 10 MG tablet; Take 60 mg (6 tablets) on the first day then decrease by 10 mg (1 tablet) daily until done.  Dispense: 21 tablet; Refill: 0

## 2013-09-06 NOTE — Patient Instructions (Addendum)
  Start your prednisone taper for the poison oak dermatitis.  You can also apply cortisone 10 to the affected areas.  Please go to the [place] [facility] for your right knee xray.  We will contact you with the results and any further instructions once they are available.  Rest the knee, elevate leg as much as possible, ice alternating with heat. Ice should be applied for 15 minutes at a time. Do this 4-5 times daily.  Do not take any ibuprofen, aleve, motrin while on the prednisone taper.

## 2013-09-06 NOTE — Progress Notes (Signed)
Pre visit review using our clinic review tool, if applicable. No additional management support is needed unless otherwise documented below in the visit note. 

## 2013-09-08 ENCOUNTER — Telehealth: Payer: Self-pay | Admitting: Adult Health

## 2013-09-08 DIAGNOSIS — M25461 Effusion, right knee: Secondary | ICD-10-CM

## 2013-09-08 NOTE — Telephone Encounter (Signed)
Small amount of fluid in her knee. I am referring her to ortho

## 2013-09-08 NOTE — Telephone Encounter (Signed)
Notified patient of Raquel's comments. Patient verbalized understanding. Patient stated that her [place], she would like to be referred there. Will call on Monday to give me the name of the office and provider.

## 2013-09-27 ENCOUNTER — Ambulatory Visit: Payer: Managed Care, Other (non HMO) | Admitting: Internal Medicine

## 2013-10-03 ENCOUNTER — Other Ambulatory Visit: Payer: Self-pay | Admitting: Internal Medicine

## 2013-10-03 NOTE — Telephone Encounter (Signed)
Ok to refill,  Refill sent  

## 2013-10-03 NOTE — Telephone Encounter (Signed)
Last OV 5.26.15, last refill 5.27.15.  Please advise refill

## 2013-10-09 ENCOUNTER — Encounter: Payer: Self-pay | Admitting: Adult Health

## 2013-10-23 ENCOUNTER — Ambulatory Visit: Payer: Managed Care, Other (non HMO) | Admitting: Internal Medicine

## 2014-01-20 ENCOUNTER — Other Ambulatory Visit: Payer: Self-pay | Admitting: Internal Medicine

## 2014-01-25 ENCOUNTER — Other Ambulatory Visit: Payer: Self-pay | Admitting: Internal Medicine

## 2014-01-25 ENCOUNTER — Other Ambulatory Visit: Payer: Self-pay | Admitting: *Deleted

## 2014-01-29 ENCOUNTER — Other Ambulatory Visit: Payer: Self-pay | Admitting: Internal Medicine

## 2014-02-13 ENCOUNTER — Telehealth: Payer: Self-pay | Admitting: Internal Medicine

## 2014-02-13 ENCOUNTER — Ambulatory Visit (INDEPENDENT_AMBULATORY_CARE_PROVIDER_SITE_OTHER): Payer: BC Managed Care – PPO | Admitting: Internal Medicine

## 2014-02-13 ENCOUNTER — Telehealth: Payer: Self-pay | Admitting: *Deleted

## 2014-02-13 ENCOUNTER — Encounter: Payer: Self-pay | Admitting: Internal Medicine

## 2014-02-13 VITALS — BP 138/80 | HR 87 | Temp 98.7°F | Resp 16 | Ht 66.0 in | Wt 186.0 lb

## 2014-02-13 DIAGNOSIS — M2240 Chondromalacia patellae, unspecified knee: Secondary | ICD-10-CM

## 2014-02-13 DIAGNOSIS — F411 Generalized anxiety disorder: Secondary | ICD-10-CM

## 2014-02-13 DIAGNOSIS — E669 Obesity, unspecified: Secondary | ICD-10-CM

## 2014-02-13 DIAGNOSIS — H65199 Other acute nonsuppurative otitis media, unspecified ear: Secondary | ICD-10-CM

## 2014-02-13 DIAGNOSIS — E66811 Obesity, class 1: Secondary | ICD-10-CM

## 2014-02-13 DIAGNOSIS — M5431 Sciatica, right side: Secondary | ICD-10-CM

## 2014-02-13 MED ORDER — TRAMADOL HCL 50 MG PO TABS: 50.0000 mg | ORAL_TABLET | Freq: Four times a day (QID) | ORAL | Status: DC | PRN

## 2014-02-13 MED ORDER — AMOXICILLIN-POT CLAVULANATE 875-125 MG PO TABS: 1.0000 | ORAL_TABLET | Freq: Two times a day (BID) | ORAL | Status: DC

## 2014-02-13 MED ORDER — IBUPROFEN 800 MG PO TABS: 800.0000 mg | ORAL_TABLET | Freq: Three times a day (TID) | ORAL | Status: DC | PRN

## 2014-02-13 MED ORDER — PREDNISONE (PAK) 10 MG PO TABS: ORAL_TABLET | ORAL | Status: DC

## 2014-02-13 MED ORDER — ALPRAZOLAM 0.25 MG PO TABS: 0.2500 mg | ORAL_TABLET | Freq: Every evening | ORAL | Status: DC | PRN

## 2014-02-13 NOTE — Telephone Encounter (Signed)
Call pharmacy,   patinet has been contacted. And is NOT taking meloxicam  ,  Prefers to have ibuprofen when she is able to resume.

## 2014-02-13 NOTE — Telephone Encounter (Signed)
Patient stated she does not take the Mobic.

## 2014-02-13 NOTE — Progress Notes (Signed)
Patient ID: Christine Monroe, female   DOB: 1975-08-19, 38 y.o.   MRN: [id number]  Patient Active Problem List   Diagnosis Date Noted  . Non-suppurative otitis media 02/15/2014  . Sciatica of right side 02/15/2014  . Elevated blood-pressure reading without diagnosis of hypertension 12/01/2012  . Generalized anxiety disorder 12/01/2012  . Obesity (BMI 30.0-34.9) 12/01/2012    Subjective:  CC:   Chief Complaint  Patient presents with  . Acute Visit    left sided facial pain and eye  patient wears contacts.  . Otalgia    left ear pain.    HPI:   Christine Monroe is a 38 y.o. female who presents for evaluation of several issues:  1) Left sided eye pain accompanied by left ear pain and pressure intermittent for  the past 10 days,  No fevers no URI symptoms.   2)New onset sciatica .  Pain radiates to right ankle. No history of fall or trauma. Present for 3 to  4 weeks,  No improvement with NSAIDs    Past Medical History  Diagnosis Date  . Panic attacks Aug 03, 1999    since mother death    Past Surgical History  Procedure Laterality Date  . Cholecystectomy  08-03-2006       The following portions of the patient's history were reviewed and updated as appropriate: Allergies, current medications, and problem list.    Review of Systems:   Patient denies headache, fevers, malaise, unintentional weight loss, skin rash, eye pain, sinus congestion and sinus pain, sore throat, dysphagia,  hemoptysis , cough, dyspnea, wheezing, chest pain, palpitations, orthopnea, edema, abdominal pain, nausea, melena, diarrhea, constipation, flank pain, dysuria, hematuria, urinary  Frequency, nocturia, numbness, tingling, seizures,  Focal weakness, Loss of consciousness,  Tremor, insomnia, depression, anxiety, and suicidal ideation.     History   Social History  . Marital Status: Married    Spouse Name: N/A    Number of Children: N/A  . Years of Education: N/A   Occupational History  . Not on file.    Social History Main Topics  . Smoking status: Never Smoker   . Smokeless tobacco: Never Used  . Alcohol Use: No  . Drug Use: No  . Sexual Activity: Yes   Other Topics Concern  . Not on file   Social History Narrative    Objective:  Filed Vitals:   02/13/14 1528  BP: 138/80  Pulse: 87  Temp: 98.7 F (37.1 C)  Resp: 16     General appearance: alert, cooperative and appears stated age Ears:left TM injected,  Effusion, no bulging normal right TM and external ear canals both ears Throat: lips, mucosa, and tongue normal; teeth and gums normal Neck: no adenopathy, no carotid bruit, supple, symmetrical, trachea midline and thyroid not enlarged, symmetric, no tenderness/mass/nodules Back: symmetric, no curvature. ROM normal. No CVA tenderness. Lungs: clear to auscultation bilaterally Heart: regular rate and rhythm, S1, S2 normal, no murmur, click, rub or gallop Abdomen: soft, non-tender; bowel sounds normal; no masses,  no organomegaly Pulses: 2+ and symmetric Skin: Skin color, texture, turgor normal. No rashes or lesions Lymph nodes: Cervical, supraclavicular, and axillary nodes normal. BAck:  Positive straight leg raise, right leg   Assessment and Plan:  Non-suppurative otitis media Given chronicity of symptoms, development of facial pain and ear pain concerning for bacterial URI,  Will treat with empiric antibiotics, prednisone taper, decongestants, and saline lavage.    Sciatica of right side Tramadol and prednisone taper.  If pain  is not improved in 1 weeks imaging    Updated Medication List Outpatient Encounter Prescriptions as of 02/13/2014  Medication Sig  . ALPRAZolam (XANAX) 0.25 MG tablet Take 1 tablet (0.25 mg total) by mouth at bedtime as needed for sleep.  Marland Kitchen PARoxetine (PAXIL) 20 MG tablet TAKE 1 TABLET BY MOUTH EVERY DAY  . [DISCONTINUED] ALPRAZolam (XANAX) 0.25 MG tablet Take 1 tablet (0.25 mg total) by mouth at bedtime as needed for sleep.  .  [DISCONTINUED] ibuprofen (ADVIL,MOTRIN) 800 MG tablet Take 1 tablet (800 mg total) by mouth every 8 (eight) hours as needed.  . [DISCONTINUED] ibuprofen (ADVIL,MOTRIN) 800 MG tablet Take 1 tablet (800 mg total) by mouth every 8 (eight) hours as needed.  Marland Kitchen amoxicillin-clavulanate (AUGMENTIN) 875-125 MG per tablet Take 1 tablet by mouth 2 (two) times daily.  . predniSONE (STERAPRED UNI-PAK) 10 MG tablet 6 tablets on Day 1 , then reduce by 1 tablet daily until gone  . traMADol (ULTRAM) 50 MG tablet Take 1 tablet (50 mg total) by mouth every 6 (six) hours as needed.  . [DISCONTINUED] BELVIQ 10 MG TABS TAKE 1 TABLET BY MOUTH TWICE A DAY  . [DISCONTINUED] esomeprazole (NEXIUM) 20 MG capsule Take 1 capsule (20 mg total) by mouth daily at 12 noon.  . [DISCONTINUED] predniSONE (DELTASONE) 10 MG tablet Take 60 mg (6 tablets) on the first day then decrease by 10 mg (1 tablet) daily until done.     No orders of the defined types were placed in this encounter.    No Follow-up on file.

## 2014-02-13 NOTE — Progress Notes (Signed)
Pre-visit discussion using our clinic review tool. No additional management support is needed unless otherwise documented below in the visit note.  

## 2014-02-13 NOTE — Telephone Encounter (Signed)
Tell the pharmacist to fill only the ibuprofen

## 2014-02-13 NOTE — Telephone Encounter (Signed)
Christine Monroe, pharmacist called to inform us that pt is currently taking Mobic prescribed by [place].  Further states pt has been on Mobic since June 2015.  Tennis Ship states she is not going to dispense the Mobic until she hears back from [facility]  Please advise Ibuprofen 800mg  Rx.

## 2014-02-13 NOTE — Patient Instructions (Addendum)
I am treating you for sinusitis/otitis which is a complication from your viral infection due to  persistent sinus congestion.   I am prescribing an antibiotic (augmentin) and a prednisone taper  To manage the infection and the inflammation in your ear/sinuses.   I also advise use of the following OTC meds to help with your other symptoms.   Take generic OTC benadryl 25 mg every 8 hours AS NEEDED for the drainage,  Sudafed PE  10 to 30 mg every 8 hours for the congestion, you may substitute Afrin nasal spray for the nighttime dose of sudafed PE  If needed to prevent insomnia.  flush your sinuses twice daily with Simply Saline (do over the sink because if you do it right you will spit out globs of mucus)  DO NOT TAKE THE IBUPROFEN FOR 10 DAYS.  THE PREDNISONE WILL STAY IN YOUR SYSTEM THAT LONG  You can use the tramadol for pain ,  It can be combined with tylenol and prednisone or ibuprofen.  If the sciatica does not resolve with the prednisone taper,  Call to have an MRI scheduled

## 2014-02-13 NOTE — Telephone Encounter (Signed)
I spoke with [facility] the pharmacist again.  She states the pt requested both Mobic and Ibuprofen be filled today.  Please advise

## 2014-02-13 NOTE — Telephone Encounter (Signed)
Tell patient to stop the mobic as well while she is taking the prednisone,  Do not fill the ibuprofen.  i fdc'd it she can resume the mobic in 10 days

## 2014-02-14 NOTE — Telephone Encounter (Signed)
The Ibuprofen was filled

## 2014-02-15 ENCOUNTER — Encounter: Payer: Self-pay | Admitting: Internal Medicine

## 2014-02-15 DIAGNOSIS — M5431 Sciatica, right side: Secondary | ICD-10-CM | POA: Insufficient documentation

## 2014-02-15 DIAGNOSIS — M2240 Chondromalacia patellae, unspecified knee: Secondary | ICD-10-CM | POA: Insufficient documentation

## 2014-02-15 DIAGNOSIS — H6590 Unspecified nonsuppurative otitis media, unspecified ear: Secondary | ICD-10-CM | POA: Insufficient documentation

## 2014-02-15 NOTE — Assessment & Plan Note (Signed)
With panic attacks ,  Rare managed with alprazolam prn and daily paxil.  

## 2014-02-15 NOTE — Assessment & Plan Note (Signed)
Did not tolerate trial of Belviq in April.  Resume low GI diet.

## 2014-02-15 NOTE — Assessment & Plan Note (Signed)
Given chronicity of symptoms, development of facial pain and ear pain concerning for bacterial URI,  Will treat with empiric antibiotics, prednisone taper, decongestants, and saline lavage.

## 2014-02-15 NOTE — Assessment & Plan Note (Addendum)
No relief with meloxicam which was prescribed elsewhere on sept 23rd. Wants to resume ibuprofen 800 mg .  Will start with Tramadol and prednisone taper. And suspend meloxicam and ibuprofen for 10 days.    If pain is not improved in 2 weeks, will need  imaging

## 2014-04-27 ENCOUNTER — Other Ambulatory Visit: Payer: Self-pay | Admitting: Internal Medicine

## 2014-07-04 ENCOUNTER — Encounter: Payer: Self-pay | Admitting: Nurse Practitioner

## 2014-07-04 ENCOUNTER — Ambulatory Visit (INDEPENDENT_AMBULATORY_CARE_PROVIDER_SITE_OTHER): Payer: BLUE CROSS/BLUE SHIELD | Admitting: Nurse Practitioner

## 2014-07-04 VITALS — BP 100/72 | HR 90 | Temp 98.3°F | Resp 14 | Ht 66.0 in | Wt 177.0 lb

## 2014-07-04 DIAGNOSIS — K2100 Gastro-esophageal reflux disease with esophagitis, without bleeding: Secondary | ICD-10-CM

## 2014-07-04 NOTE — Progress Notes (Signed)
   Subjective:    Patient ID: Christine Monroe, female    DOB: Nov 17, 1975, 38 y.o.   MRN: [id number]  HPI  Christine Monroe is a 39 yo female with a CC of GERD.   1) Friday night felt burning in chest  Chicken greek salad with tomatoes, No dressing   Steak and vegetables with water for dinner   Coffee in the morning- cream no sugar   No recent use of ibuprofen, and tramadol  Took 2 pills and felt they were stuck last Thursday   Paxil and Doxycline- for stye taking for awhile through April  Taking OTC Zantac   Review of Systems  Constitutional: Negative for fever, chills, diaphoresis and fatigue.  Respiratory: Negative for chest tightness, shortness of breath and wheezing.   Cardiovascular: Negative for chest pain, palpitations and leg swelling.  Gastrointestinal: Negative for nausea, vomiting, abdominal pain, diarrhea and abdominal distention.       Burning sensation in chest to throat.   Skin: Negative for rash.  Neurological: Negative for dizziness, weakness, numbness and headaches.  Psychiatric/Behavioral: The patient is not nervous/anxious.        Objective:   Physical Exam  Constitutional: She is oriented to person, place, and time. She appears well-developed and well-nourished. No distress.  BP 100/72 mmHg  Pulse 90  Temp(Src) 98.3 F (36.8 C) (Oral)  Resp 14  Ht 5\' 6"  (1.676 m)  Wt 177 lb (80.287 kg)  BMI 28.58 kg/m2  SpO2 98%   HENT:  Head: Normocephalic and atraumatic.  Right Ear: External ear normal.  Left Ear: External ear normal.  Cardiovascular: Normal rate, regular rhythm, normal heart sounds and intact distal pulses.  Exam reveals no gallop and no friction rub.   No murmur heard. Pulmonary/Chest: Effort normal and breath sounds normal. No respiratory distress. She has no wheezes. She has no rales. She exhibits no tenderness.  Abdominal: Soft. Bowel sounds are normal. She exhibits no distension and no mass. There is no tenderness. There is no rebound and no  guarding.  Neurological: She is alert and oriented to person, place, and time. No cranial nerve deficit. She exhibits normal muscle tone. Coordination normal.  Skin: Skin is warm and dry. No rash noted. She is not diaphoretic.  Psychiatric: She has a normal mood and affect. Her behavior is normal. Judgment and thought content normal.      Assessment & Plan:

## 2014-07-04 NOTE — Progress Notes (Signed)
Pre visit review using our clinic review tool, if applicable. No additional management support is needed unless otherwise documented below in the visit note. 

## 2014-07-04 NOTE — Patient Instructions (Signed)
Please make sure to take the Doxycline at least 30 min (preferably an hour) before lying down or 1 hour before a meal. Lots of water.

## 2014-07-10 DIAGNOSIS — K219 Gastro-esophageal reflux disease without esophagitis: Secondary | ICD-10-CM | POA: Insufficient documentation

## 2014-07-10 NOTE — Assessment & Plan Note (Addendum)
Believe it is due to taking the doxycyline and laying down soon after. Will continue on Zantac OTC and drinking water. Take the doxycyline at LEAST 30 min. Prior to laying down. FU prn worsening/failure to improve.

## 2014-07-24 ENCOUNTER — Other Ambulatory Visit: Payer: Self-pay | Admitting: Internal Medicine

## 2014-10-29 ENCOUNTER — Other Ambulatory Visit: Payer: Self-pay | Admitting: Internal Medicine

## 2015-01-16 ENCOUNTER — Other Ambulatory Visit: Payer: Self-pay | Admitting: Surgical

## 2015-01-16 MED ORDER — PAROXETINE HCL 20 MG PO TABS: 20.0000 mg | ORAL_TABLET | Freq: Every day | ORAL | Status: DC

## 2015-01-16 NOTE — Telephone Encounter (Signed)
Sent Rx for Paroxetine to pharmacy

## 2015-05-07 ENCOUNTER — Ambulatory Visit (INDEPENDENT_AMBULATORY_CARE_PROVIDER_SITE_OTHER): Payer: BLUE CROSS/BLUE SHIELD

## 2015-05-07 DIAGNOSIS — Z23 Encounter for immunization: Secondary | ICD-10-CM

## 2015-05-07 NOTE — Progress Notes (Signed)
Placed PPD in Right arm.  Patient will return on Thursday for reading.  Left paperwork to complete and pick up.

## 2015-05-09 ENCOUNTER — Ambulatory Visit: Payer: BLUE CROSS/BLUE SHIELD

## 2015-05-09 DIAGNOSIS — Z111 Encounter for screening for respiratory tuberculosis: Secondary | ICD-10-CM

## 2015-05-09 LAB — TB SKIN TEST
Induration: 0 mm
TB SKIN TEST: NEGATIVE

## 2015-05-09 NOTE — Progress Notes (Signed)
Patient came in for PPD reading.  Results documented and added to sheet that patient had dropped off on Tuesday.  Patient will return on Monday for OV.

## 2015-05-13 ENCOUNTER — Ambulatory Visit (INDEPENDENT_AMBULATORY_CARE_PROVIDER_SITE_OTHER): Payer: BLUE CROSS/BLUE SHIELD | Admitting: Internal Medicine

## 2015-05-13 ENCOUNTER — Encounter: Payer: Self-pay | Admitting: Internal Medicine

## 2015-05-13 VITALS — BP 102/64 | HR 80 | Temp 97.8°F | Resp 12 | Ht 66.0 in | Wt 178.2 lb

## 2015-05-13 DIAGNOSIS — Z0000 Encounter for general adult medical examination without abnormal findings: Secondary | ICD-10-CM

## 2015-05-13 DIAGNOSIS — Z789 Other specified health status: Secondary | ICD-10-CM

## 2015-05-13 DIAGNOSIS — E785 Hyperlipidemia, unspecified: Secondary | ICD-10-CM

## 2015-05-13 DIAGNOSIS — R5383 Other fatigue: Secondary | ICD-10-CM

## 2015-05-13 DIAGNOSIS — N393 Stress incontinence (female) (male): Secondary | ICD-10-CM | POA: Diagnosis not present

## 2015-05-13 LAB — POCT URINALYSIS DIPSTICK
Bilirubin, UA: NEGATIVE
GLUCOSE UA: NEGATIVE
Ketones, UA: NEGATIVE
Leukocytes, UA: NEGATIVE
Nitrite, UA: NEGATIVE
PROTEIN UA: NEGATIVE
Spec Grav, UA: 1.025
UROBILINOGEN UA: 1
pH, UA: 6

## 2015-05-13 NOTE — Patient Instructions (Signed)
Referral to [facility] for evaluation of your stress incontinence

## 2015-05-13 NOTE — Progress Notes (Addendum)
Patient ID: Christine Monroe, female    DOB: 17-Dec-1975  Age: 40 y.o. MRN: [id number]  The patient is here for annual physical.  Last visit was Nov 2015 for acute issues     PPD was placed on jan 24 and was negative  Last mammogram ordered by [facility] oct 2016     The risk factors are reflected in the social history.  The roster of all physicians providing medical care to patient - is listed in the Snapshot section of the chart.  Activities of daily living:  The patient is 100% independent in all ADLs: dressing, toileting, feeding as well as independent mobility  Home safety : The patient has smoke detectors in the home. They wear seatbelts.  There are no firearms at home. There is no violence in the home.   There is no risks for hepatitis, STDs or HIV. There is no   history of blood transfusion. They have no travel history to infectious disease endemic areas of the world.  The patient has seen their dentist in the last six month. They have seen their eye doctor in the last year. They admit to slight hearing difficulty with regard to whispered voices and some television programs.  They have deferred audiologic testing in the last year.  They do not  have excessive sun exposure. Discussed the need for sun protection: hats, long sleeves and use of sunscreen if there is significant sun exposure.   Diet: the importance of a healthy diet is discussed. They do have a healthy diet.  The benefits of regular aerobic exercise were discussed. She walks 4 times per week ,  20 minutes.   Depression screen: there are no signs or vegative symptoms of depression- irritability, change in appetite, anhedonia, sadness/tearfullness.  Cognitive assessment: the patient manages all their financial and personal affairs and is actively engaged. They could relate day,date,year and events; recalled 2/3 objects at 3 minutes; performed clock-face test normally.  The following portions of the patient's history were  reviewed and updated as appropriate: allergies, current medications, past family history, past medical history,  past surgical history, past social history  and problem list.  Visual acuity was not assessed per patient preference since she has regular follow up with her ophthalmologist. Hearing and body mass index were assessed and reviewed.   During the course of the visit the patient was educated and counseled about appropriate screening and preventive services including : fall prevention , diabetes screening, nutrition counseling, colorectal cancer screening, and recommended immunizations.    CC: The primary encounter diagnosis was Measles, mumps, rubella (MMR) vaccination status unknown. Diagnoses of Stress incontinence in female, Hyperlipidemia, Other fatigue, and Encounter for preventive health examination were also pertinent to this visit.   She has  Been experiencing stress incontinence since starting to exercise.  History of 2 vaginal deliveries . Wants to see urogyn  History Marygrace has a past medical history of Panic attacks (2001).   She has past surgical history that includes Cholecystectomy (2008).   Her family history includes Aneurysm (age of onset: 17) in her mother; Cancer in her father; Hypertension in her father.She reports that she has never smoked. She has never used smokeless tobacco. She reports that she does not drink alcohol or use illicit drugs.  Outpatient Prescriptions Prior to Visit  Medication Sig Dispense Refill  . ALPRAZolam (XANAX) 0.25 MG tablet Take 1 tablet (0.25 mg total) by mouth at bedtime as needed for sleep. 30 tablet 2  .  ibuprofen (ADVIL,MOTRIN) 800 MG tablet Take 1 tablet (800 mg total) by mouth every 8 (eight) hours as needed. 60 tablet 0  . PARoxetine (PAXIL) 20 MG tablet Take 1 tablet (20 mg total) by mouth daily. 90 tablet 0  . traMADol (ULTRAM) 50 MG tablet Take 1 tablet (50 mg total) by mouth every 6 (six) hours as needed. 120 tablet 2   No  facility-administered medications prior to visit.    Review of Systems   Patient denies headache, fevers, malaise, unintentional weight loss, skin rash, eye pain, sinus congestion and sinus pain, sore throat, dysphagia,  hemoptysis , cough, dyspnea, wheezing, chest pain, palpitations, orthopnea, edema, abdominal pain, nausea, melena, diarrhea, constipation, flank pain, dysuria, hematuria, urinary  Frequency, nocturia, numbness, tingling, seizures,  Focal weakness, Loss of consciousness,  Tremor, insomnia, depression, anxiety, and suicidal ideation.      Objective:  BP 102/64 mmHg  Pulse 80  Temp(Src) 97.8 F (36.6 C) (Oral)  Resp 12  Ht _0  (1.676 m)  Wt 178 lb 4 oz (80.854 kg)  BMI 28.78 kg/m2  SpO2 98%  LMP 05/03/2015  Physical Exam   General appearance: alert, cooperative and appears stated age Head: Normocephalic, without obvious abnormality, atraumatic Eyes: conjunctivae/corneas clear. PERRL, EOM's intact. Fundi benign. Ears: normal TM's and external ear canals both ears Nose: Nares normal. Septum midline. Mucosa normal. No drainage or sinus tenderness. Throat: lips, mucosa, and tongue normal; teeth and gums normal Neck: no adenopathy, no carotid bruit, no JVD, supple, symmetrical, trachea midline and thyroid not enlarged, symmetric, no tenderness/mass/nodules Lungs: clear to auscultation bilaterally Heart: regular rate and rhythm, S1, S2 normal, no murmur, click, rub or gallop Abdomen: soft, non-tender; bowel sounds normal; no masses,  no organomegaly Extremities: extremities normal, atraumatic, no cyanosis or edema Pulses: 2+ and symmetric Skin: Skin color, texture, turgor normal. No rashes or lesions Neurologic: Alert and oriented X 3, normal strength and tone. Normal symmetric reflexes. Normal coordination and gait.     Assessment & Plan:   Problem List Items Addressed This Visit    Stress incontinence in female    No signs of UTI. Referral to [facility] .       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  During the course of the visit the patient was educated and counseled about appropriate screening and preventive services and screenings were discussed with regard to timing of initiation of cervical and breast cancer .  She had abnormal lipids during screening for hyperlipidemia last year and repeat lipids are pending    nutrition counseling, skin cancer screening has been done, along with review of the age appropriate recommended immunizations.  Printed recommendations for health maintenance screenings was given.        Other Visit Diagnoses    Hyperlipidemia        Relevant Orders    Lipid panel (Completed)    Other fatigue        Relevant Orders    TSH (Completed)  Comp Met (CMET) (Completed)       I am having Ms. Kolk maintain her ALPRAZolam, traMADol, ibuprofen, and PARoxetine.  No orders of the defined types were placed in this encounter.    There are no discontinued medications.  Follow-up: No Follow-up on file.   Crecencio Mc, MD

## 2015-05-13 NOTE — Progress Notes (Signed)
Pre-visit discussion using our clinic review tool. No additional management support is needed unless otherwise documented below in the visit note.  

## 2015-05-14 DIAGNOSIS — Z01818 Encounter for other preprocedural examination: Secondary | ICD-10-CM | POA: Insufficient documentation

## 2015-05-14 DIAGNOSIS — Z789 Other specified health status: Secondary | ICD-10-CM | POA: Insufficient documentation

## 2015-05-14 LAB — URINALYSIS, ROUTINE W REFLEX MICROSCOPIC
Bilirubin Urine: NEGATIVE
Hgb urine dipstick: NEGATIVE
KETONES UR: NEGATIVE
LEUKOCYTES UA: NEGATIVE
Nitrite: NEGATIVE
SPECIFIC GRAVITY, URINE: 1.02 (ref 1.000–1.030)
Total Protein, Urine: NEGATIVE
UROBILINOGEN UA: 0.2 (ref 0.0–1.0)
Urine Glucose: NEGATIVE
pH: 6 (ref 5.0–8.0)

## 2015-05-14 LAB — COMPREHENSIVE METABOLIC PANEL
ALBUMIN: 4.2 g/dL (ref 3.5–5.2)
ALT: 11 U/L (ref 0–35)
AST: 17 U/L (ref 0–37)
Alkaline Phosphatase: 53 U/L (ref 39–117)
BUN: 19 mg/dL (ref 6–23)
CALCIUM: 9.4 mg/dL (ref 8.4–10.5)
CHLORIDE: 103 meq/L (ref 96–112)
CO2: 29 mEq/L (ref 19–32)
Creatinine, Ser: 0.9 mg/dL (ref 0.40–1.20)
GFR: 73.06 mL/min (ref >60.00)
Glucose, Bld: 78 mg/dL (ref 70–99)
Potassium: 4.2 mEq/L (ref 3.5–5.1)
SODIUM: 139 meq/L (ref 135–145)
Total Bilirubin: 0.3 mg/dL (ref 0.2–1.2)
Total Protein: 7 g/dL (ref 6.0–8.3)

## 2015-05-14 LAB — LIPID PANEL
CHOLESTEROL: 143 mg/dL (ref 0–200)
HDL: 52.7 mg/dL (ref >39.00)
LDL Cholesterol: 71 mg/dL (ref 0–99)
NonHDL: 90.64
Total CHOL/HDL Ratio: 3
Triglycerides: 98 mg/dL (ref 0.0–149.0)
VLDL: 19.6 mg/dL (ref 0.0–40.0)

## 2015-05-14 LAB — TSH: TSH: 0.98 u[IU]/mL (ref 0.35–4.50)

## 2015-05-14 NOTE — Assessment & Plan Note (Signed)
Annual wellness  exam was done as well as a comprehensive physical exam  .  During the course of the visit the patient was educated and counseled about appropriate screening and preventive services and screenings were discussed with regard to timing of initiation of cervical and breast cancer .  She had abnormal lipids during screening for hyperlipidemia last year and repeat lipids are pending    nutrition counseling, skin cancer screening has been done, along with review of the age appropriate recommended immunizations.  Printed recommendations for health maintenance screenings was given.

## 2015-05-14 NOTE — Assessment & Plan Note (Signed)
No signs of UTI. Referral to [facility] .

## 2015-05-14 NOTE — Assessment & Plan Note (Signed)
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

## 2015-05-15 ENCOUNTER — Encounter: Payer: Self-pay | Admitting: *Deleted

## 2015-05-15 LAB — MUMPS ANTIBODY, IGG: MUMPS IGG: 8.7 [AU]/ml (ref <9.00)

## 2015-05-15 LAB — RUBEOLA ANTIBODY IGG: RUBEOLA IGG: 116 [AU]/ml — AB (ref <25.00)

## 2015-05-16 LAB — RUBELLA ANTIBODY, IGM: RUBELLA: 0.75 (ref <0.91)

## 2015-05-18 ENCOUNTER — Other Ambulatory Visit: Payer: Self-pay | Admitting: Internal Medicine

## 2015-05-20 ENCOUNTER — Telehealth: Payer: Self-pay

## 2015-05-20 NOTE — Telephone Encounter (Signed)
Spoke with pt about test results, pt verbalized understanding

## 2015-05-20 NOTE — Telephone Encounter (Signed)
Pt wants test results and to find out where the paperwork she talked to Dr. Derrel Nip about

## 2015-05-27 ENCOUNTER — Telehealth: Payer: Self-pay | Admitting: Internal Medicine

## 2015-05-27 MED ORDER — PREDNISONE 10 MG PO TABS: ORAL_TABLET | ORAL | Status: DC

## 2015-05-27 NOTE — Telephone Encounter (Signed)
Attempted to call patient, left a VM to return my call with details.

## 2015-05-27 NOTE — Telephone Encounter (Signed)
Prednisone taper sent to [facility] on [address].  I can see her at 4:30 tomorrow if she would like to be seen.  Disregard the 4:30 patient on my schedule (patient died tonight)

## 2015-05-27 NOTE — Telephone Encounter (Signed)
You last saw patient on 05/13/15.  Please advise do you need to see her?

## 2015-05-27 NOTE — Telephone Encounter (Signed)
Not if I can get enough information to make a diagnosis.  Is the rash on both sides of her face and does it itch?

## 2015-05-27 NOTE — Telephone Encounter (Signed)
Spoke with the patient, she has the rash on the right side of her face, middle to left side of her neck.  Has spots near her mouth and chin.  It goes down both arms, on left arm small area has broken open and pus liquid came out.  It has spread to her belly.  She was outside last weekend cutting trees in her yard.  She has a inside dog that she thinks may have given this to her?? It itches like crazy.  Please advise.  She did ask about a shot? Says she has had [facility] numerous times and this is how it looks.

## 2015-05-27 NOTE — Telephone Encounter (Signed)
The patient is wanting something sent to the pharmacy for [facility] . She has it on her face, neck and chin .

## 2015-05-28 NOTE — Telephone Encounter (Signed)
Spoke with the patient, she will try the predinsone and then if it gets worse she will call for an appt.  Thanks

## 2015-08-29 ENCOUNTER — Other Ambulatory Visit: Payer: Self-pay | Admitting: Internal Medicine

## 2016-03-06 ENCOUNTER — Other Ambulatory Visit: Payer: Self-pay | Admitting: Internal Medicine

## 2016-03-09 NOTE — Telephone Encounter (Signed)
Ok to refill patient lat OV 1/17.

## 2016-06-01 ENCOUNTER — Telehealth: Payer: Self-pay | Admitting: Internal Medicine

## 2016-06-01 NOTE — Telephone Encounter (Signed)
Pt last refill for Paxil was on 02/25/16. Last OV 05/13/15, with no future appts at this time.

## 2016-06-01 NOTE — Telephone Encounter (Signed)
Pt is scheduled for 3/23

## 2016-06-01 NOTE — Telephone Encounter (Signed)
Could you call pt and schedule OV prior to anymore refills?

## 2016-06-01 NOTE — Telephone Encounter (Signed)
Please notify patient that the prescription  was Refilled for 30 days only because it has been 13  months since last visit. Marland Kitchen  OFFICE VISIT NEEDED prior to any more refills

## 2016-06-09 ENCOUNTER — Other Ambulatory Visit: Payer: Self-pay | Admitting: Internal Medicine

## 2016-06-10 NOTE — Telephone Encounter (Signed)
NO OV since 04/3015 please advise to refill Paroxetine.

## 2016-06-10 NOTE — Telephone Encounter (Signed)
Refill for 30 days only.  OFFICE VISIT NEEDED prior to any more refills 

## 2016-07-02 ENCOUNTER — Telehealth: Payer: Self-pay | Admitting: Internal Medicine

## 2016-07-02 NOTE — Telephone Encounter (Signed)
Pt called wanting to inform that she cannot come sooner to due starting a new job. Pt is scheduled for 10/20/2016. Thank you!

## 2016-07-02 NOTE — Telephone Encounter (Signed)
Left detailed message for patient to call office and schedule follow up appointment.

## 2016-07-03 ENCOUNTER — Ambulatory Visit: Payer: BLUE CROSS/BLUE SHIELD | Admitting: Internal Medicine

## 2016-07-03 NOTE — Telephone Encounter (Signed)
You can use a Monday evening appointment and refill everything for 90 days  but the controlled substances. I cannot refill tramadol or alprazolam until she is seen.

## 2016-07-03 NOTE — Telephone Encounter (Signed)
Patient was to schedule for medication refills patient canceled appointment for today and stated she cannot come in due to starting new job until 10/20/16.

## 2016-07-03 NOTE — Telephone Encounter (Signed)
Left detailed message to return call to office. 

## 2016-07-06 ENCOUNTER — Telehealth: Payer: Self-pay | Admitting: *Deleted

## 2016-07-06 NOTE — Telephone Encounter (Signed)
Patient requested to have a [place] late appt for a follow up after 1630(medication refill). She requested 07/20/16. Please advise if this time and date will be okay.  Pt contact [phone]

## 2016-07-07 NOTE — Telephone Encounter (Signed)
Pt is aware of appt date and time. 

## 2016-07-07 NOTE — Telephone Encounter (Signed)
Can you schedule this appt please? Thank you so much!!!

## 2016-07-07 NOTE — Telephone Encounter (Signed)
Yes ok to use

## 2016-07-07 NOTE — Telephone Encounter (Signed)
Scheduled for 5 on 07/20/16 make sure patient aware.

## 2016-07-07 NOTE — Telephone Encounter (Signed)
Please advise 

## 2016-07-20 ENCOUNTER — Ambulatory Visit (INDEPENDENT_AMBULATORY_CARE_PROVIDER_SITE_OTHER): Payer: BC Managed Care – PPO | Admitting: Internal Medicine

## 2016-07-20 DIAGNOSIS — F411 Generalized anxiety disorder: Secondary | ICD-10-CM

## 2016-07-20 DIAGNOSIS — Z0000 Encounter for general adult medical examination without abnormal findings: Secondary | ICD-10-CM | POA: Diagnosis not present

## 2016-07-20 MED ORDER — PAROXETINE HCL 20 MG PO TABS: ORAL_TABLET | ORAL | 1 refills | Status: DC

## 2016-07-20 MED ORDER — ALPRAZOLAM 0.25 MG PO TABS: 0.2500 mg | ORAL_TABLET | Freq: Every evening | ORAL | 2 refills | Status: DC | PRN

## 2016-07-20 NOTE — Progress Notes (Signed)
Pre visit review using our clinic review tool, if applicable. No additional management support is needed unless otherwise documented below in the visit note. 

## 2016-07-20 NOTE — Patient Instructions (Signed)
Good to see you!!  For your allergy/drainage  continue daily zyrtec  When you come in from outside,  Or when you wake up in the morning:  You should try [facility]'s Sinus rinse ;  It is a stong sinus "flush" using water and medicated salts.  Do it over the sink because it can be a bit messy

## 2016-07-21 NOTE — Progress Notes (Signed)
Patient ID: Christine Monroe, female    DOB: Mar 02, 1976  Age: 41 y.o. MRN: [id number]  The patient is here for annual wellness examination and management of other chronic and acute problems.   HAD PAP SMEAR AND MAMMOGRAM ONE MONTH AGO  Dr  Christine Monroe.    The risk factors are reflected in the social history.  The roster of all physicians providing medical care to patient - is listed in the Snapshot section of the chart.  Home safety : The patient has smoke detectors in the home. They wear seatbelts.  There are no firearms at home. There is no violence in the home.   There is no risks for hepatitis, STDs or HIV. There is no   history of blood transfusion. They have no travel history to infectious disease endemic areas of the world.  The patient has seen their dentist in the last six month. They have seen their eye doctor in the last year.   They do not  have excessive sun exposure. Discussed the need for sun protection: hats, long sleeves and use of sunscreen if there is significant sun exposure.   Diet: the importance of a healthy diet is discussed. They do have a healthy diet.  The benefits of regular aerobic exercise were discussed. She walks 4 times per week ,  20 minutes.   Depression screen: there are no signs or vegative symptoms of depression- irritability, change in appetite, anhedonia, sadness/tearfullness. The following portions of the patient's history were reviewed and updated as appropriate: allergies, current medications, past family history, past medical history,  past surgical history, past social history  and problem list.  Visual acuity was not assessed per patient preference since she has regular follow up with her ophthalmologist. Hearing and body mass index were assessed and reviewed.   During the course of the visit the patient was educated and counseled about appropriate screening and preventive services including : fall prevention , diabetes screening, nutrition counseling,  colorectal cancer screening, and recommended immunizations.    CC: The encounter diagnosis was Generalized anxiety disorder.    history of GAD :  Her symptoms have been  managed with paxil .  She uses  ALPRAZOLAM only to manage her fear of flying  nd has a trip coming up in June   Some rhinitis and sneezing .   Some rhinitis and sneezing .  Left sided eye pain accompanied by ear pain and pressure intermittent for  the past 10 days,  No fevers no URI symptoms.    History Christine Monroe has a past medical history of Panic attacks (2001).   She has a past surgical history that includes Cholecystectomy (2008).   Her family history includes Aneurysm (age of onset: 47) in her mother; Cancer in her father; Hypertension in her father.She reports that she has never smoked. She has never used smokeless tobacco. She reports that she does not drink alcohol or use drugs.  Outpatient Medications Prior to Visit  Medication Sig Dispense Refill  . ibuprofen (ADVIL,MOTRIN) 800 MG tablet Take 1 tablet (800 mg total) by mouth every 8 (eight) hours as needed. 60 tablet 0  . predniSONE (DELTASONE) 10 MG tablet 6 tabletsdaily for 3 days,  then reduce by 1 tablet daily until gone 33 tablet 0  . traMADol (ULTRAM) 50 MG tablet Take 1 tablet (50 mg total) by mouth every 6 (six) hours as needed. 120 tablet 2  . ALPRAZolam (XANAX) 0.25 MG tablet Take 1 tablet (0.25 mg total) by  mouth at bedtime as needed for sleep. 30 tablet 2  . PARoxetine (PAXIL) 20 MG tablet TAKE 1 TABLET (20 MG TOTAL) BY MOUTH DAILY. 30 tablet 0  . PARoxetine (PAXIL) 20 MG tablet TAKE 1 TABLET (20 MG TOTAL) BY MOUTH DAILY. 30 tablet 0   No facility-administered medications prior to visit.     Review of Systems   Patient denies headache, fevers, malaise, unintentional weight loss, skin rash, eye pain, sinus congestion and sinus pain, sore throat, dysphagia,  hemoptysis , cough, dyspnea, wheezing, chest pain, palpitations, orthopnea, edema, abdominal  pain, nausea, melena, diarrhea, constipation, flank pain, dysuria, hematuria, urinary  Frequency, nocturia, numbness, tingling, seizures,  Focal weakness, Loss of consciousness,  Tremor, insomnia, depression, anxiety, and suicidal ideation.      Objective:  There were no vitals taken for this visit.  Physical Exam   General appearance: alert, cooperative and appears stated age Head: Normocephalic, without obvious abnormality, atraumatic Eyes: conjunctivae/corneas clear. PERRL, EOM's intact. Fundi benign. Ears: normal TM's and external ear canals both ears Nose: Nares normal. Septum midline. Mucosa normal. No drainage or sinus tenderness. Throat: lips, mucosa, and tongue normal; teeth and gums normal Neck: no adenopathy, no carotid bruit, no JVD, supple, symmetrical, trachea midline and thyroid not enlarged, symmetric, no tenderness/mass/nodules Lungs: clear to auscultation bilaterally Breasts: normal appearance, no masses or tenderness Heart: regular rate and rhythm, S1, S2 normal, no murmur, click, rub or gallop Abdomen: soft, non-tender; bowel sounds normal; no masses,  no organomegaly Extremities: extremities normal, atraumatic, no cyanosis or edema Pulses: 2+ and symmetric Skin: Skin color, texture, turgor normal. No rashes or lesions Neurologic: Alert and oriented X 3, normal strength and tone. Normal symmetric reflexes. Normal coordination and gait.      Assessment & Plan:   Problem List Items Addressed This Visit    Generalized anxiety disorder    With panic attacks ,  Rare managed with alprazolam prn and daily paxil.          I am having Ms. Christine Monroe maintain her traMADol, ibuprofen, predniSONE, ALPRAZolam, and PARoxetine.  Meds ordered this encounter  Medications  . ALPRAZolam (XANAX) 0.25 MG tablet    Sig: Take 1 tablet (0.25 mg total) by mouth at bedtime as needed for sleep.    Dispense:  30 tablet    Refill:  2  . PARoxetine (PAXIL) 20 MG tablet    Sig: TAKE 1  TABLET (20 MG TOTAL) BY MOUTH DAILY.    Dispense:  90 tablet    Refill:  1    Medications Discontinued During This Encounter  Medication Reason  . PARoxetine (PAXIL) 20 MG tablet Error  . ALPRAZolam (XANAX) 0.25 MG tablet Reorder  . PARoxetine (PAXIL) 20 MG tablet Reorder    Follow-up: Return in about 6 months (around 01/19/2017).   Crecencio Mc, MD

## 2016-07-21 NOTE — Assessment & Plan Note (Signed)
With panic attacks ,  Rare managed with alprazolam prn and daily paxil.

## 2016-10-20 ENCOUNTER — Encounter: Payer: Self-pay | Admitting: Internal Medicine

## 2016-10-20 ENCOUNTER — Ambulatory Visit (INDEPENDENT_AMBULATORY_CARE_PROVIDER_SITE_OTHER): Payer: BC Managed Care – PPO | Admitting: Internal Medicine

## 2016-10-20 VITALS — BP 110/80 | HR 78 | Temp 98.8°F | Resp 16 | Ht 66.0 in | Wt 204.8 lb

## 2016-10-20 DIAGNOSIS — E669 Obesity, unspecified: Secondary | ICD-10-CM | POA: Diagnosis not present

## 2016-10-20 DIAGNOSIS — F411 Generalized anxiety disorder: Secondary | ICD-10-CM | POA: Diagnosis not present

## 2016-10-20 DIAGNOSIS — E538 Deficiency of other specified B group vitamins: Secondary | ICD-10-CM

## 2016-10-20 DIAGNOSIS — R5383 Other fatigue: Secondary | ICD-10-CM

## 2016-10-20 LAB — COMPREHENSIVE METABOLIC PANEL
ALBUMIN: 4.1 g/dL (ref 3.5–5.2)
ALK PHOS: 90 U/L (ref 39–117)
ALT: 31 U/L (ref 0–35)
AST: 23 U/L (ref 0–37)
BUN: 11 mg/dL (ref 6–23)
CO2: 25 mEq/L (ref 19–32)
Calcium: 8.9 mg/dL (ref 8.4–10.5)
Chloride: 104 mEq/L (ref 96–112)
Creatinine, Ser: 0.78 mg/dL (ref 0.40–1.20)
GFR: 85.59 mL/min (ref >60.00)
Glucose, Bld: 81 mg/dL (ref 70–99)
POTASSIUM: 3.7 meq/L (ref 3.5–5.1)
Sodium: 138 mEq/L (ref 135–145)
TOTAL PROTEIN: 7.1 g/dL (ref 6.0–8.3)
Total Bilirubin: 0.4 mg/dL (ref 0.2–1.2)

## 2016-10-20 LAB — VITAMIN B12: VITAMIN B 12: 490 pg/mL (ref 211–911)

## 2016-10-20 MED ORDER — LIRAGLUTIDE -WEIGHT MANAGEMENT 18 MG/3ML ~~LOC~~ SOPN: 0.6000 mg | PEN_INJECTOR | Freq: Every day | SUBCUTANEOUS | 0 refills | Status: DC

## 2016-10-20 MED ORDER — PEN NEEDLES 31G X 6 MM MISC: 0 refills | Status: DC

## 2016-10-20 NOTE — Progress Notes (Signed)
Subjective:  Patient ID: Christine Monroe, female    DOB: 1975/11/20  Age: 41 y.o. MRN: [id number]  CC: The primary encounter diagnosis was Fatigue, unspecified type. Diagnoses of B12 deficiency, Generalized anxiety disorder, and Obesity (BMI 30-39.9) were also pertinent to this visit.  HPI Christine Monroe presents for follow up on GAD and obesity.  GAD:   managed with Paxil and prn alprazolam  Last seen January 2017 .  Did not tolerate a trial without it. Due to increased irritability, increased insomnia. And negative thoughts  Obesity:  She has had a Weight gain of 26 lbs since January 2017;  She was turned down for surgery.   "fed up" eating whatever she wants.  Married ,  2 kids,  One just started college.  55 yr old boy.     Had her tsh checked in February by Ronita Hipps ,   PAP smear and mammogram normal in February    Outpatient Medications Prior to Visit  Medication Sig Dispense Refill  . ALPRAZolam (XANAX) 0.25 MG tablet Take 1 tablet (0.25 mg total) by mouth at bedtime as needed for sleep. 30 tablet 2  . ibuprofen (ADVIL,MOTRIN) 800 MG tablet Take 1 tablet (800 mg total) by mouth every 8 (eight) hours as needed. 60 tablet 0  . PARoxetine (PAXIL) 20 MG tablet TAKE 1 TABLET (20 MG TOTAL) BY MOUTH DAILY. 90 tablet 1  . predniSONE (DELTASONE) 10 MG tablet 6 tabletsdaily for 3 days,  then reduce by 1 tablet daily until gone (Patient not taking: Reported on 10/20/2016) 33 tablet 0  . traMADol (ULTRAM) 50 MG tablet Take 1 tablet (50 mg total) by mouth every 6 (six) hours as needed. (Patient not taking: Reported on 10/20/2016) 120 tablet 2   No facility-administered medications prior to visit.     Review of Systems;  Patient denies headache, fevers, malaise, unintentional weight loss, skin rash, eye pain, sinus congestion and sinus pain, sore throat, dysphagia,  hemoptysis , cough, dyspnea, wheezing, chest pain, palpitations, orthopnea, edema, abdominal pain, nausea, melena, diarrhea,  constipation, flank pain, dysuria, hematuria, urinary  Frequency, nocturia, numbness, tingling, seizures,  Focal weakness, Loss of consciousness,  Tremor, insomnia, depression, anxiety, and suicidal ideation.      Objective:  BP 110/80 (BP Location: Left Arm, Patient Position: Sitting, Cuff Size: Large)   Pulse 78   Temp 98.8 F (37.1 C) (Oral)   Resp 16   Ht 5\' 6"  (1.676 m)   Wt 204 lb 12.8 oz (92.9 kg)   SpO2 98%   BMI 33.06 kg/m   BP Readings from Last 3 Encounters:  10/20/16 110/80  05/13/15 102/64  07/04/14 100/72    Wt Readings from Last 3 Encounters:  10/20/16 204 lb 12.8 oz (92.9 kg)  05/13/15 178 lb 4 oz (80.9 kg)  07/04/14 177 lb (80.3 kg)    General appearance: alert, cooperative and appears stated age Ears: normal TM's and external ear canals both ears Throat: lips, mucosa, and tongue normal; teeth and gums normal Neck: no adenopathy, no carotid bruit, supple, symmetrical, trachea midline and thyroid not enlarged, symmetric, no tenderness/mass/nodules Back: symmetric, no curvature. ROM normal. No CVA tenderness. Lungs: clear to auscultation bilaterally Heart: regular rate and rhythm, S1, S2 normal, no murmur, click, rub or gallop Abdomen: soft, non-tender; bowel sounds normal; no masses,  no organomegaly Pulses: 2+ and symmetric Skin: Skin color, texture, turgor normal. No rashes or lesions Lymph nodes: Cervical, supraclavicular, and axillary nodes normal.  No results found  for: HGBA1C  Lab Results  Component Value Date   CREATININE 0.78 10/20/2016   CREATININE 0.90 05/13/2015    Lab Results  Component Value Date   GLUCOSE 81 10/20/2016   CHOL 143 05/13/2015   TRIG 98.0 05/13/2015   HDL 52.70 05/13/2015   LDLCALC 71 05/13/2015   ALT 31 10/20/2016   AST 23 10/20/2016   NA 138 10/20/2016   K 3.7 10/20/2016   CL 104 10/20/2016   CREATININE 0.78 10/20/2016   BUN 11 10/20/2016   CO2 25 10/20/2016   TSH 0.98 05/13/2015    Assessment & Plan:    Problem List Items Addressed This Visit    Generalized anxiety disorder    With panic attacks ,  Rare,  managed with alprazolam prn and daily paxil.  Resume medication for longterm use       Obesity (BMI 30-39.9)    Reviewed her previous success at weight loss,  Her goal weight for BMI < 30 . I have addressed  BMI and recommended wt loss of 10% of body weigh over the next 6 months using a low glycemic index diet and regular exercise a minimum of 5 days per week. .trial of saxenda        Relevant Medications   Liraglutide -Weight Management (SAXENDA) 18 MG/3ML SOPN    Other Visit Diagnoses    Fatigue, unspecified type    -  Primary   Relevant Orders   Comprehensive metabolic panel (Completed)   B12 deficiency       Relevant Orders   Vitamin B12 (Completed)    A total of 25 minutes of face to face time was spent with patient more than half of which was spent in counselling about the above mentioned conditions  and coordination of care   I have discontinued Ms. Urquiza's traMADol and predniSONE. I am also having her start on Liraglutide -Weight Management and Pen Needles. Additionally, I am having her maintain her ibuprofen, ALPRAZolam, and PARoxetine.  Meds ordered this encounter  Medications  . Liraglutide -Weight Management (SAXENDA) 18 MG/3ML SOPN    Sig: Inject 0.6 mg into the skin daily. Increase dose weekly as follows: Week 2: 1.2 mg daily ; Week 3: 1.8 mg daily; Week 4: 2.4 mg daily    Dispense:  9 mL    Refill:  0  . Insulin Pen Needle (PEN NEEDLES) 31G X 6 MM MISC    Sig: For use with victoza /saxenda    Dispense:  100 each    Refill:  0    Medications Discontinued During This Encounter  Medication Reason  . predniSONE (DELTASONE) 10 MG tablet Therapy completed  . traMADol (ULTRAM) 50 MG tablet Patient has not taken in last 30 days    Follow-up: Return in about 3 months (around 01/20/2017).   Crecencio Mc, MD

## 2016-10-20 NOTE — Patient Instructions (Addendum)
I WANT TO HELP YOU ACHIEVE YOUR WEIGHT LOSS GOAL  I am recommending use of the medication called Saxenda to help you lose weight.  It is similar to a a medicine that is used to treat diabetes called Victoza,  So It may lower your blood sugars .   It is injected daily in incrementally increasing doses (if tolerated,  Nausea usually resolves in a few days)"  0.6 mg daily   Week 1 1.2 mg daily Week 2 1.8 mg  Daly Week 3 2.4 mg daily Week 4 3.0 mg daily Week 5 and ongoing   If you want to bring the pen with you to be shown how to give yourself the dose,  We wil make you an  RN visit once you pick up your medication from your pharmacy    This is  Dr. Lupita Dawn  example of a  "Low GI"  Diet:  It will allow you to lose 4 to 8  lbs  per month if you follow it carefully.  Your goal with exercise is a minimum of 30 minutes of aerobic exercise 5 days per week (Walking does not count once it becomes easy!)    All of the foods can be found at grocery stores and in bulk at [employer].  The Atkins protein bars and shakes are available in more varieties at Target, [employer] and [employer].     7 AM Breakfast:  Choose from the following:  Low carbohydrate Protein  Shakes (I recommend the  Premier Protein chocolate shakes,  EAS AdvantEdge "Carb Control" shakes  Or the Atkins shakes all are under 3 net carbs)     a scrambled egg/bacon/cheese burrito made with Mission's "carb balance" whole wheat tortilla  (about 10 net carbs )  Regulatory affairs officer (basically a quiche without the pastry crust) that is eaten cold and very convenient way to get your eggs.  8 carbs)  If you make your own protein shakes, avoid bananas and pineapple,  And use low carb greek yogurt or original /unsweetened almond or soy milk    Avoid cereal and bananas, oatmeal and cream of wheat and grits. They are loaded with carbohydrates!   10 AM: high protein snack:  Protein bar by Atkins (the snack size, under 200 cal,  usually < 6 net carbs).    A stick of cheese:  Around 1 carb,  100 cal     Dannon Light n Fit [place] Yogurt  (80 cal, 8 carbs)  Other so called "protein bars" and Greek yogurts tend to be loaded with carbohydrates.  Remember, in food advertising, the word "energy" is synonymous for " carbohydrate."  Lunch:   A Sandwich using the bread choices listed, Can use any  Eggs,  lunchmeat, grilled meat or canned tuna), avocado, regular mayo/mustard  and cheese.  A Salad using blue cheese, ranch,  Goddess or vinagrette,  Avoid taco shells, croutons or "confetti" and no "candied nuts" but regular nuts OK.   No pretzels, nabs  or chips.  Pickles and miniature sweet peppers are a good low carb alternative that provide a "crunch"  The bread is the only source of carbohydrate in a sandwich and  can be decreased by trying some of the attached alternatives to traditional loaf bread   Avoid "Low fat dressings, as well as Barry Brunner and [employer] dressings They are loaded with sugar!   3 PM/ Mid day  Snack:  Consider  1 ounce of  almonds, walnuts, pistachios, pecans, peanuts,  Macadamia nuts or a nut medley.  Avoid "granola and granola bars "  Mixed nuts are ok in moderation as long as there are no raisins,  cranberries or dried fruit.   KIND bars are OK if you get the low glycemic index variety   Try the prosciutto/mozzarella cheese sticks by [employer]  In deli /backery section   High protein      6 PM  Dinner:     Meat/fowl/fish with a green salad, and either broccoli, cauliflower, green beans, spinach, brussel sprouts or  Lima beans. DO NOT BREAD THE PROTEIN!!      There is a low carb pasta by Dreamfield's that is acceptable and tastes great: only 5 digestible carbs/serving.( All grocery stores but BJs carry it ) Several ready made meals are available low carb:   Try Michel Angelo's chicken piccata or chicken or eggplant parm over low carb pasta.(Lowes and BJs)   Marjory Lies Sanchez's "Carnitas" (pulled pork,  no sauce,  0 carbs) or his beef pot roast to make a dinner burrito (at [employer]J's)  Pesto over low carb pasta (bj's sells a good quality pesto in the center refrigerated section of the deli   Try satueeing  Cheral Marker with mushroooms as a good side   [employer] makes a mashed cauliflower that tastes like mashed potatoes  Whole wheat pasta is still full of digestible carbs and  Not as low in glycemic index as Dreamfield's.   Brown rice is still rice,  So skip the rice and noodles if you eat [place] or [place] (or at least limit to 1/2 cup)  9 PM snack :   Breyer's "low carb" fudgsicle or  ice cream bar (Carb Smart line), or  Weight Watcher's ice cream bar , or another "no sugar added" ice cream;  a serving of fresh berries/cherries with whipped cream   Cheese or DANNON'S LlGHT N FIT GREEK YOGURT  8 ounces of [place] unsweetened almond/cococunut milk    Treat yourself to a parfait made with whipped cream blueberiies, walnuts and vanilla greek yogurt  Avoid bananas, pineapple, grapes  and watermelon on a regular basis because they are high in sugar.  THINK OF THEM AS DESSERT  Remember that snack Substitutions should be less than 10 NET carbs per serving and meals < 20 carbs. Remember to subtract fiber grams to get the "net carbs."

## 2016-10-21 ENCOUNTER — Telehealth: Payer: Self-pay

## 2016-10-21 NOTE — Telephone Encounter (Signed)
PA for Saxenda completed on cover my meds

## 2016-10-22 DIAGNOSIS — E669 Obesity, unspecified: Secondary | ICD-10-CM | POA: Insufficient documentation

## 2016-10-22 NOTE — Assessment & Plan Note (Signed)
Reviewed her previous success at weight loss,  Her goal weight for BMI < 30 . I have addressed  BMI and recommended wt loss of 10% of body weigh over the next 6 months using a low glycemic index diet and regular exercise a minimum of 5 days per week. .trial of saxenda

## 2016-10-22 NOTE — Assessment & Plan Note (Signed)
With panic attacks ,  Rare,  managed with alprazolam prn and daily paxil.  Resume medication for longterm use

## 2016-10-30 ENCOUNTER — Other Ambulatory Visit: Payer: Self-pay

## 2016-10-30 MED ORDER — INSULIN PEN NEEDLE 31G X 5 MM MISC: 0 refills | Status: DC

## 2016-11-11 ENCOUNTER — Ambulatory Visit (INDEPENDENT_AMBULATORY_CARE_PROVIDER_SITE_OTHER): Payer: BC Managed Care – PPO

## 2016-11-11 DIAGNOSIS — E669 Obesity, unspecified: Secondary | ICD-10-CM | POA: Diagnosis not present

## 2016-11-11 NOTE — Progress Notes (Signed)
Patient comes into office today on how to use medication Saxenda. Instructed how to perform the techniques and to inject into subcutaneous area such as the arm, stomach or thigh area. Patient expressed understanding. Advise patient if she had any questions she should call the office or view online at the web site of [place] on instruction on how to give medication.

## 2016-11-15 ENCOUNTER — Other Ambulatory Visit: Payer: Self-pay | Admitting: Internal Medicine

## 2017-01-16 ENCOUNTER — Other Ambulatory Visit: Payer: Self-pay | Admitting: Internal Medicine

## 2017-01-20 ENCOUNTER — Ambulatory Visit: Payer: BC Managed Care – PPO | Admitting: Internal Medicine

## 2017-02-02 ENCOUNTER — Other Ambulatory Visit: Payer: Self-pay | Admitting: Internal Medicine

## 2017-02-17 ENCOUNTER — Encounter: Payer: Self-pay | Admitting: Internal Medicine

## 2017-02-17 ENCOUNTER — Ambulatory Visit: Payer: BC Managed Care – PPO | Admitting: Internal Medicine

## 2017-02-17 VITALS — BP 110/78 | HR 79 | Temp 97.9°F | Resp 15 | Ht 66.0 in | Wt 194.4 lb

## 2017-02-17 DIAGNOSIS — E669 Obesity, unspecified: Secondary | ICD-10-CM | POA: Diagnosis not present

## 2017-02-17 DIAGNOSIS — F411 Generalized anxiety disorder: Secondary | ICD-10-CM | POA: Diagnosis not present

## 2017-02-17 DIAGNOSIS — Z79899 Other long term (current) drug therapy: Secondary | ICD-10-CM | POA: Diagnosis not present

## 2017-02-17 DIAGNOSIS — J0100 Acute maxillary sinusitis, unspecified: Secondary | ICD-10-CM

## 2017-02-17 MED ORDER — LEVOFLOXACIN 500 MG PO TABS: 500.0000 mg | ORAL_TABLET | Freq: Every day | ORAL | 0 refills | Status: DC

## 2017-02-17 NOTE — Patient Instructions (Addendum)
I am changing your antibiotic to Levaquin  Once daily for 7   Stop the amoxicillin Flush your sinuses with saline (nettie pott,  Milta Deiters med's sinus rinse ) once daily  Use bendaryl for any post nasal drip 25 mg at bedtime    Taking an antibiotic can create an imbalance in the normal population of bacteria that live in the small intestine.  This imbalance can persist for 3 months.   Taking a probiotic ( Align, Floraque or Culturelle), the generic version of one of these over the counter medications, or an alternative form (kombucha,  Yogurt, or another dietary source) for a minimum of 3 weeks may help prevent a serious antibiotic associated diarrhea  Called clostridium dificile colitis that occurs when the bacteria population is altered .  Taking a probiotic may also prevent vaginitis due to yeast infections and can be continued indefinitely if you feel that it improves your digestion or your elimination (bowels).

## 2017-02-17 NOTE — Progress Notes (Signed)
Subjective:  Patient ID: Christine Monroe, female    DOB: 02/23/1976  Age: 41 y.o. MRN: [id number]  CC: The primary encounter diagnosis was Long-term use of high-risk medication. Diagnoses of Acute non-recurrent maxillary sinusitis, Obesity (BMI 30-39.9), and Generalized anxiety disorder were also pertinent to this visit.  HPI Christine Monroe presents for follow up on GAD,  Obesity and GERD  Obesity:  Saxenda trial offered in July .  Has lost 10 lbs  (5%)   Last  A total of 18 lbs before losing ground due to dietary indulgences.   GAD: Paxil and alprazolam resumed.  Feeling better,  Sleeping well,  No panic attacks.   Had novasure  Ablation 2 weeks ago  Sinusitis: diagnosed on Monday, saw dentist thinking she had an abscessed tooth,   Dentist gave her amoxicillin , she started it on Monday .  Still having  left sided jaw pain,  Left sided headache,  Ear pain on the left .  Taking the otc meds did not help  Some drainage but no rhinirrhea fevers . Had 2 loose stools yesterday   Outpatient Medications Prior to Visit  Medication Sig Dispense Refill  . ALPRAZolam (XANAX) 0.25 MG tablet Take 1 tablet (0.25 mg total) by mouth at bedtime as needed for sleep. 30 tablet 2  . B-D UF III MINI PEN NEEDLES 31G X 5 MM MISC USE AS DIRECTED. 100 each 0  . ibuprofen (ADVIL,MOTRIN) 800 MG tablet Take 1 tablet (800 mg total) by mouth every 8 (eight) hours as needed. 60 tablet 0  . Insulin Pen Needle (PEN NEEDLES) 31G X 6 MM MISC For use with victoza /saxenda 100 each 0  . PARoxetine (PAXIL) 20 MG tablet TAKE 1 TABLET (20 MG TOTAL) BY MOUTH DAILY. 90 tablet 1  . SAXENDA 18 MG/3ML SOPN INJECT 0.6 MG INTO THE SKIN DAILY THEN INCREASE WEEK 2 1.2MG  DAILY, WEEK 3 1.8MG  DAILY WK 4 2.4MG  15 pen 0  . amoxicillin (AMOXIL) 500 MG capsule TAKE 2 CAPSULES BY MOUTH AS INITIAL DOSE AND 1 CAPSULE BY MOUTH 4 TIMES A DAY AFTER  0   No facility-administered medications prior to visit.     Review of Systems;  Patient denies   fevers, malaise, unintentional weight loss, skin rash, eye pain, sinus congestion and sinus pain, sore throat, dysphagia,  hemoptysis , cough, dyspnea, wheezing, chest pain, palpitations, orthopnea, edema, abdominal pain, nausea, melena, diarrhea, constipation, flank pain, dysuria, hematuria, urinary  Frequency, nocturia, numbness, tingling, seizures,  Focal weakness, Loss of consciousness,  Tremor, insomnia, depression, anxiety, and suicidal ideation.      Objective:  BP 110/78 (BP Location: Left Arm, Patient Position: Sitting, Cuff Size: Large)   Pulse 79   Temp 97.9 F (36.6 C) (Oral)   Resp 15   Ht 5\' 6"  (1.676 m)   Wt 194 lb 6.4 oz (88.2 kg)   SpO2 96%   BMI 31.38 kg/m   BP Readings from Last 3 Encounters:  02/17/17 110/78  10/20/16 110/80  05/13/15 102/64    Wt Readings from Last 3 Encounters:  02/17/17 194 lb 6.4 oz (88.2 kg)  10/20/16 204 lb 12.8 oz (92.9 kg)  05/13/15 178 lb 4 oz (80.9 kg)    General appearance: alert, cooperative and appears stated age Face: left maxillary sinus tenderness .  Right maxillary and bilateral frontals normal.  Ears: cloudy left  TM's and external ear canals both ears Throat: lips, mucosa, and tongue normal; teeth and gums normal Neck:  left cervical lymphadenopathy, no carotid bruit, supple, symmetrical, trachea midline and thyroid not enlarged, symmetric, no tenderness/mass/nodules Back: symmetric, no curvature. ROM normal. No CVA tenderness. Lungs: clear to auscultation bilaterally Heart: regular rate and rhythm, S1, S2 normal, no murmur, click, rub or gallop Abdomen: soft, non-tender; bowel sounds normal; no masses,  no organomegaly Pulses: 2+ and symmetric Skin: Skin color, texture, turgor normal. No rashes or lesions Lymph nodes: Cervical, supraclavicular, and axillary nodes normal.  No results found for: HGBA1C  Lab Results  Component Value Date   CREATININE 0.75 02/17/2017   CREATININE 0.78 10/20/2016   CREATININE 0.90  05/13/2015    Lab Results  Component Value Date   WBC 9.5 02/17/2017   HGB 13.6 02/17/2017   HCT 41.2 02/17/2017   PLT 292.0 02/17/2017   GLUCOSE 76 02/17/2017   CHOL 143 05/13/2015   TRIG 98.0 05/13/2015   HDL 52.70 05/13/2015   LDLCALC 71 05/13/2015   ALT 12 02/17/2017   AST 14 02/17/2017   NA 137 02/17/2017   K 4.3 02/17/2017   CL 103 02/17/2017   CREATININE 0.75 02/17/2017   BUN 12 02/17/2017   CO2 28 02/17/2017   TSH 0.98 05/13/2015     Assessment & Plan:   Problem List Items Addressed This Visit    Generalized anxiety disorder    With panic attacks dramaticllally reduced with prn alprazolam prn and daily paxil. No changes today       Obesity (BMI 30-39.9)    She has lost 18 lbs using Saxenda . LFTs are normal.  Encouraged continue use,  Daily exercise.  Refills given   Lab Results  Component Value Date   ALT 12 02/17/2017   AST 14 02/17/2017   ALKPHOS 65 02/17/2017   BILITOT 0.4 02/17/2017         Sinusitis, acute, maxillary    Non recurrent despite amoxicillin x 48 hours.  Change abx to levaquin.  Probiotic advised.       Relevant Medications   levofloxacin (LEVAQUIN) 500 MG tablet    Other Visit Diagnoses    Long-term use of high-risk medication    -  Primary   Relevant Orders   CBC with Differential/Platelet (Completed)   Comprehensive metabolic panel (Completed)     A total of 25 minutes of face to face time was spent with patient more than half of which was spent in counselling about the above mentioned conditions  and coordination of care  I have discontinued Jacquie L. Vary's amoxicillin. I am also having her start on levofloxacin. Additionally, I am having her maintain her ibuprofen, ALPRAZolam, Pen Needles, SAXENDA, PARoxetine, and B-D UF III MINI PEN NEEDLES.  Meds ordered this encounter  Medications  . DISCONTD: amoxicillin (AMOXIL) 500 MG capsule    Sig: TAKE 2 CAPSULES BY MOUTH AS INITIAL DOSE AND 1 CAPSULE BY MOUTH 4 TIMES A DAY  AFTER    Refill:  0  . levofloxacin (LEVAQUIN) 500 MG tablet    Sig: Take 1 tablet (500 mg total) daily by mouth.    Dispense:  7 tablet    Refill:  0    Medications Discontinued During This Encounter  Medication Reason  . amoxicillin (AMOXIL) 500 MG capsule     Follow-up: No Follow-up on file.   Crecencio Mc, MD

## 2017-02-18 LAB — COMPREHENSIVE METABOLIC PANEL
ALBUMIN: 4.2 g/dL (ref 3.5–5.2)
ALK PHOS: 65 U/L (ref 39–117)
ALT: 12 U/L (ref 0–35)
AST: 14 U/L (ref 0–37)
BILIRUBIN TOTAL: 0.4 mg/dL (ref 0.2–1.2)
BUN: 12 mg/dL (ref 6–23)
CALCIUM: 9.6 mg/dL (ref 8.4–10.5)
CO2: 28 meq/L (ref 19–32)
CREATININE: 0.75 mg/dL (ref 0.40–1.20)
Chloride: 103 mEq/L (ref 96–112)
GFR: 89.41 mL/min (ref >60.00)
Glucose, Bld: 76 mg/dL (ref 70–99)
Potassium: 4.3 mEq/L (ref 3.5–5.1)
Sodium: 137 mEq/L (ref 135–145)
TOTAL PROTEIN: 7.4 g/dL (ref 6.0–8.3)

## 2017-02-18 LAB — CBC WITH DIFFERENTIAL/PLATELET
Basophils Absolute: 0.1 10*3/uL (ref 0.0–0.1)
Basophils Relative: 1.2 % (ref 0.0–3.0)
EOS ABS: 0.2 10*3/uL (ref 0.0–0.7)
Eosinophils Relative: 2.6 % (ref 0.0–5.0)
HEMATOCRIT: 41.2 % (ref 36.0–46.0)
HEMOGLOBIN: 13.6 g/dL (ref 12.0–15.0)
LYMPHS PCT: 28.4 % (ref 12.0–46.0)
Lymphs Abs: 2.7 10*3/uL (ref 0.7–4.0)
MCHC: 33.2 g/dL (ref 30.0–36.0)
MCV: 87.5 fl (ref 78.0–100.0)
MONOS PCT: 6.4 % (ref 3.0–12.0)
Monocytes Absolute: 0.6 10*3/uL (ref 0.1–1.0)
NEUTROS ABS: 5.8 10*3/uL (ref 1.4–7.7)
Neutrophils Relative %: 61.4 % (ref 43.0–77.0)
Platelets: 292 10*3/uL (ref 150.0–400.0)
RBC: 4.7 Mil/uL (ref 3.87–5.11)
RDW: 14.3 % (ref 11.5–15.5)
WBC: 9.5 10*3/uL (ref 4.0–10.5)

## 2017-02-20 DIAGNOSIS — J0100 Acute maxillary sinusitis, unspecified: Secondary | ICD-10-CM | POA: Insufficient documentation

## 2017-02-20 MED ORDER — LIRAGLUTIDE -WEIGHT MANAGEMENT 18 MG/3ML ~~LOC~~ SOPN: 3.0000 mg | PEN_INJECTOR | Freq: Every day | SUBCUTANEOUS | 0 refills | Status: DC

## 2017-02-20 NOTE — Assessment & Plan Note (Signed)
Non recurrent despite amoxicillin x 48 hours.  Change abx to levaquin.  Probiotic advised.

## 2017-02-20 NOTE — Assessment & Plan Note (Signed)
She has lost 18 lbs using Saxenda . LFTs are normal.  Encouraged continue use,  Daily exercise.  Refills given   Lab Results  Component Value Date   ALT 12 02/17/2017   AST 14 02/17/2017   ALKPHOS 65 02/17/2017   BILITOT 0.4 02/17/2017

## 2017-02-20 NOTE — Assessment & Plan Note (Signed)
With panic attacks dramaticllally reduced with prn alprazolam prn and daily paxil. No changes today  

## 2017-03-09 ENCOUNTER — Other Ambulatory Visit: Payer: Self-pay | Admitting: Internal Medicine

## 2017-03-09 ENCOUNTER — Telehealth: Payer: Self-pay

## 2017-03-09 NOTE — Telephone Encounter (Signed)
PA for saxenda has been submitted on covermymeds.  

## 2017-03-10 ENCOUNTER — Telehealth: Payer: Self-pay | Admitting: Internal Medicine

## 2017-03-10 MED ORDER — CLINDAMYCIN HCL 300 MG PO CAPS: 300.0000 mg | ORAL_CAPSULE | Freq: Three times a day (TID) | ORAL | 0 refills | Status: DC

## 2017-03-10 MED ORDER — LEVOFLOXACIN 500 MG PO TABS: 500.0000 mg | ORAL_TABLET | Freq: Every day | ORAL | 0 refills | Status: DC

## 2017-03-10 MED ORDER — PREDNISONE 10 MG PO TABS: ORAL_TABLET | ORAL | 0 refills | Status: DC

## 2017-03-10 NOTE — Telephone Encounter (Signed)
Left message that meds were at [facility] and to call back if any questions.

## 2017-03-10 NOTE — Telephone Encounter (Signed)
PA was approved. Pharmacy needed a new rx.

## 2017-03-10 NOTE — Telephone Encounter (Signed)
PA for saxenda has been approved.

## 2017-03-10 NOTE — Telephone Encounter (Signed)
Copied from [place] [phone]. Topic: General - Other >> Mar 10, 2017  3:48 PM Darl Householder, RMA wrote: Reason for CRM: patient is requesting a refill on Levofloxacin 500 mg to be sent to [facility], due to she is still experiencing sinus symptoms

## 2017-03-10 NOTE — Telephone Encounter (Signed)
Patient called and says that sinus pressure on L side has returned. Facial/ear pain. Denies fever. She says she has some runny nose and post nasal drip. Patient denies cough or any other symptoms. She was seen for this on 11/7 and given 7 days of antibiotic.

## 2017-03-10 NOTE — Telephone Encounter (Signed)
Need to broaden antibiotic coverage since infection has recurred.  levaquin 500 mg x 10 days,  Clindamycin 300 mg three times daily x 10 days,,  Prednisone taper x 6 days,  And probiotic.  meds sent to pharmacy.   She will need to see ENT if this does not knock it out.

## 2017-03-11 NOTE — Telephone Encounter (Signed)
Spoke with patient to be sure she got her meds and advised patient of all other instructions in message.

## 2017-05-08 ENCOUNTER — Other Ambulatory Visit: Payer: Self-pay | Admitting: Internal Medicine

## 2017-05-10 ENCOUNTER — Other Ambulatory Visit: Payer: Self-pay | Admitting: Internal Medicine

## 2017-06-11 ENCOUNTER — Other Ambulatory Visit: Payer: Self-pay | Admitting: Internal Medicine

## 2017-06-14 NOTE — Telephone Encounter (Signed)
Refilled: 05/10/2017 Last OV: 02/17/2017 Next OV: 08/18/2017

## 2017-06-16 ENCOUNTER — Other Ambulatory Visit: Payer: Self-pay | Admitting: Internal Medicine

## 2017-07-12 ENCOUNTER — Other Ambulatory Visit: Payer: Self-pay | Admitting: Internal Medicine

## 2017-07-15 ENCOUNTER — Other Ambulatory Visit: Payer: Self-pay | Admitting: Internal Medicine

## 2017-07-15 MED ORDER — LIRAGLUTIDE -WEIGHT MANAGEMENT 18 MG/3ML ~~LOC~~ SOPN: 3.0000 mg | PEN_INJECTOR | Freq: Every day | SUBCUTANEOUS | 0 refills | Status: DC

## 2017-07-15 NOTE — Telephone Encounter (Signed)
Refilled: 06/14/2017 Last OV: 02/17/2017 Next OV: 08/18/2017

## 2017-07-15 NOTE — Telephone Encounter (Signed)
Refill on saxenda sent

## 2017-07-20 ENCOUNTER — Telehealth: Payer: Self-pay

## 2017-07-20 NOTE — Telephone Encounter (Signed)
PA for Saxenda has been submitted on covermymeds.  

## 2017-07-20 NOTE — Telephone Encounter (Signed)
Medication has been approved. Pt and pharmacy are aware.

## 2017-08-10 ENCOUNTER — Other Ambulatory Visit: Payer: Self-pay | Admitting: Internal Medicine

## 2017-08-18 ENCOUNTER — Ambulatory Visit: Payer: BC Managed Care – PPO | Admitting: Internal Medicine

## 2017-10-04 ENCOUNTER — Ambulatory Visit: Payer: BC Managed Care – PPO | Admitting: Internal Medicine

## 2017-10-17 ENCOUNTER — Other Ambulatory Visit: Payer: Self-pay | Admitting: Internal Medicine

## 2017-10-20 ENCOUNTER — Other Ambulatory Visit: Payer: Self-pay | Admitting: Internal Medicine

## 2017-10-20 NOTE — Telephone Encounter (Signed)
Refilled: 07/15/2017 Last OV: 02/17/2017 Next OV: 11/02/2017

## 2017-11-02 ENCOUNTER — Ambulatory Visit: Payer: BC Managed Care – PPO | Admitting: Internal Medicine

## 2017-11-15 ENCOUNTER — Ambulatory Visit: Payer: BC Managed Care – PPO | Admitting: Internal Medicine

## 2017-11-15 ENCOUNTER — Encounter: Payer: Self-pay | Admitting: Internal Medicine

## 2017-11-15 VITALS — BP 120/80 | HR 71 | Temp 98.5°F | Resp 15 | Ht 66.0 in | Wt 189.8 lb

## 2017-11-15 DIAGNOSIS — F411 Generalized anxiety disorder: Secondary | ICD-10-CM

## 2017-11-15 DIAGNOSIS — R030 Elevated blood-pressure reading, without diagnosis of hypertension: Secondary | ICD-10-CM

## 2017-11-15 DIAGNOSIS — E669 Obesity, unspecified: Secondary | ICD-10-CM | POA: Diagnosis not present

## 2017-11-15 DIAGNOSIS — H04553 Acquired stenosis of bilateral nasolacrimal duct: Secondary | ICD-10-CM | POA: Diagnosis not present

## 2017-11-15 DIAGNOSIS — R748 Abnormal levels of other serum enzymes: Secondary | ICD-10-CM

## 2017-11-15 LAB — COMPREHENSIVE METABOLIC PANEL
ALT: 37 U/L — ABNORMAL HIGH (ref 0–35)
AST: 27 U/L (ref 0–37)
Albumin: 4.2 g/dL (ref 3.5–5.2)
Alkaline Phosphatase: 66 U/L (ref 39–117)
BUN: 10 mg/dL (ref 6–23)
CALCIUM: 9.5 mg/dL (ref 8.4–10.5)
CHLORIDE: 104 meq/L (ref 96–112)
CO2: 30 mEq/L (ref 19–32)
Creatinine, Ser: 0.8 mg/dL (ref 0.40–1.20)
GFR: 82.71 mL/min (ref >60.00)
Glucose, Bld: 84 mg/dL (ref 70–99)
Potassium: 4.4 mEq/L (ref 3.5–5.1)
Sodium: 138 mEq/L (ref 135–145)
Total Bilirubin: 0.3 mg/dL (ref 0.2–1.2)
Total Protein: 7 g/dL (ref 6.0–8.3)

## 2017-11-15 MED ORDER — ALPRAZOLAM 0.25 MG PO TABS: 0.2500 mg | ORAL_TABLET | Freq: Every evening | ORAL | 2 refills | Status: DC | PRN

## 2017-11-15 NOTE — Progress Notes (Signed)
Subjective:  Patient ID: Christine Monroe, female    DOB: 1975/06/29  Age: 42 y.o. MRN: [id number]  CC: The primary encounter diagnosis was Obesity (BMI 30-39.9). Diagnoses of Generalized anxiety disorder, Lacrimal duct stenosis, bilateral, Elevated blood pressure reading in office without diagnosis of hypertension, and Elevated liver enzymes were also pertinent to this visit.  HPI Christine Monroe presents for follow up on weight loss managed since July 2018 with Saxenda  Had lost 18 lbs from July to November using Saxenda ,  Has lost another 5 since then .  Goal was 10 lbs since November,  However she attributes her inability to reach goal to recent weight gain during vacation ,    Seeing ophthalmoogist for management of lacrimimal  duct stenosis causing constant tearing of both eyes.  Condition did not respond to eye  Drops .  GAD: Her symptoms have been managed with paxil . She uses ALPRAZOLAM only to manage her fear of flying and the rare pani attack .          Outpatient Medications Prior to Visit  Medication Sig Dispense Refill  . Insulin Pen Needle (PEN NEEDLES) 31G X 6 MM MISC For use with victoza /saxenda 100 each 0  . PARoxetine (PAXIL) 20 MG tablet TAKE 1 TABLET (20 MG TOTAL) BY MOUTH DAILY. 90 tablet 1  . SAXENDA 18 MG/3ML SOPN INJECT 3 MG INTO THE SKIN DAILY. 30 pen 0  . ALPRAZolam (XANAX) 0.25 MG tablet Take 1 tablet (0.25 mg total) by mouth at bedtime as needed for sleep. 30 tablet 2  . B-D UF III MINI PEN NEEDLES 31G X 5 MM MISC USE AS DIRECTED. (Patient not taking: Reported on 11/15/2017) 100 each 0  . clindamycin (CLEOCIN) 300 MG capsule Take 1 capsule (300 mg total) by mouth 3 (three) times daily. (Patient not taking: Reported on 11/15/2017) 30 capsule 0  . ibuprofen (ADVIL,MOTRIN) 800 MG tablet Take 1 tablet (800 mg total) by mouth every 8 (eight) hours as needed. (Patient not taking: Reported on 11/15/2017) 60 tablet 0  . levofloxacin (LEVAQUIN) 500 MG tablet Take 1 tablet  (500 mg total) by mouth daily. (Patient not taking: Reported on 11/15/2017) 10 tablet 0  . predniSONE (DELTASONE) 10 MG tablet 6 tablets on Day 1 , then reduce by 1 tablet daily until gone (Patient not taking: Reported on 11/15/2017) 21 tablet 0   No facility-administered medications prior to visit.     Review of Systems;  Patient denies headache, fevers, malaise, unintentional weight loss, skin rash, eye pain, sinus congestion and sinus pain, sore throat, dysphagia,  hemoptysis , cough, dyspnea, wheezing, chest pain, palpitations, orthopnea, edema, abdominal pain, nausea, melena, diarrhea, constipation, flank pain, dysuria, hematuria, urinary  Frequency, nocturia, numbness, tingling, seizures,  Focal weakness, Loss of consciousness,  Tremor, insomnia, depression, anxiety, and suicidal ideation.      Objective:  BP 120/80 (BP Location: Right Arm, Patient Position: Sitting, Cuff Size: Large)   Pulse 71   Temp 98.5 F (36.9 C) (Oral)   Resp 15   Ht 5\' 6"  (1.676 m)   Wt 189 lb 12.8 oz (86.1 kg)   SpO2 97%   BMI 30.63 kg/m   BP Readings from Last 3 Encounters:  11/15/17 120/80  02/17/17 110/78  10/20/16 110/80    Wt Readings from Last 3 Encounters:  11/15/17 189 lb 12.8 oz (86.1 kg)  02/17/17 194 lb 6.4 oz (88.2 kg)  10/20/16 204 lb 12.8 oz (92.9 kg)  General appearance: alert, cooperative and appears stated age Ears: normal TM's and external ear canals both ears Throat: lips, mucosa, and tongue normal; teeth and gums normal Neck: no adenopathy, no carotid bruit, supple, symmetrical, trachea midline and thyroid not enlarged, symmetric, no tenderness/mass/nodules Back: symmetric, no curvature. ROM normal. No CVA tenderness. Lungs: clear to auscultation bilaterally Heart: regular rate and rhythm, S1, S2 normal, no murmur, click, rub or gallop Abdomen: soft, non-tender; bowel sounds normal; no masses,  no organomegaly Pulses: 2+ and symmetric Skin: Skin color, texture, turgor  normal. No rashes or lesions Lymph nodes: Cervical, supraclavicular, and axillary nodes normal.  No results found for: HGBA1C  Lab Results  Component Value Date   CREATININE 0.80 11/15/2017   CREATININE 0.75 02/17/2017   CREATININE 0.78 10/20/2016    Lab Results  Component Value Date   WBC 9.5 02/17/2017   HGB 13.6 02/17/2017   HCT 41.2 02/17/2017   PLT 292.0 02/17/2017   GLUCOSE 84 11/15/2017   CHOL 143 05/13/2015   TRIG 98.0 05/13/2015   HDL 52.70 05/13/2015   LDLCALC 71 05/13/2015   ALT 37 (H) 11/15/2017   AST 27 11/15/2017   NA 138 11/15/2017   K 4.4 11/15/2017   CL 104 11/15/2017   CREATININE 0.80 11/15/2017   BUN 10 11/15/2017   CO2 30 11/15/2017   TSH 0.98 05/13/2015    Assessment & Plan:   Problem List Items Addressed This Visit    Elevated blood pressure reading in office without diagnosis of hypertension    She has no history of hypertension but has had several elevated readings.  She has been asked to check her pressures at home and submit readings for evaluation. Renal function will be checked today  Lab Results  Component Value Date   CREATININE 0.80 11/15/2017   Lab Results  Component Value Date   NA 138 11/15/2017   K 4.4 11/15/2017   CL 104 11/15/2017   CO2 30 11/15/2017   No results found for: MICROALBUR, [id number]       Generalized anxiety disorder    With panic attacks dramaticllally reduced with prn alprazolam prn and daily paxil. No changes today       Relevant Medications   ALPRAZolam (XANAX) 0.25 MG tablet   Obesity (BMI 30-39.9) - Primary    Encouraged to resume a daily exercise progrean that includes 30 minutes of cardio daily.  Goal wt loss is 10 lbs by next visit .  saxenda refilled       Relevant Orders   Comprehensive metabolic panel (Completed)   Lacrimal duct stenosis, bilateral    Recently diagnosed,  Awaiting ophthalmology evaluation for corrective surgery      Elevated liver enzymes    Hepatic panel to be  repeated prior to next visit.       Relevant Orders   Hepatic function panel      I have discontinued Latavia L. Abshier's ibuprofen, levofloxacin, clindamycin, predniSONE, and B-D UF III MINI PEN NEEDLES. I am also having her maintain her Pen Needles, PARoxetine, SAXENDA, and ALPRAZolam.  Meds ordered this encounter  Medications  . ALPRAZolam (XANAX) 0.25 MG tablet    Sig: Take 1 tablet (0.25 mg total) by mouth at bedtime as needed for sleep.    Dispense:  30 tablet    Refill:  2    Medications Discontinued During This Encounter  Medication Reason  . clindamycin (CLEOCIN) 300 MG capsule Completed Course  . ibuprofen (ADVIL,MOTRIN) 800 MG tablet Patient  has not taken in last 30 days  . B-D UF III MINI PEN NEEDLES 29W X 5 MM MISC Duplicate  . levofloxacin (LEVAQUIN) 500 MG tablet Completed Course  . predniSONE (DELTASONE) 10 MG tablet Completed Course  . ALPRAZolam (XANAX) 0.25 MG tablet Reorder   A total of 25 minutes of face to face time was spent with patient more than half of which was spent in counselling about the above mentioned conditions  and coordination of care    Follow-up: Return in about 3 months (around 02/15/2018) for weight management withSaxenda .   Crecencio Mc, MD

## 2017-11-15 NOTE — Patient Instructions (Signed)
your blood pressure s consistently above the currently recommended acceptable standards of 120/70, so I am recommending that you   Please check your blood pressure a few times at home and send me the readings so I can determine if you need to start medication  Your goal with weight loss is 10 lbs over the next 3 months

## 2017-11-16 DIAGNOSIS — R748 Abnormal levels of other serum enzymes: Secondary | ICD-10-CM | POA: Insufficient documentation

## 2017-11-16 DIAGNOSIS — H04553 Acquired stenosis of bilateral nasolacrimal duct: Secondary | ICD-10-CM | POA: Insufficient documentation

## 2017-11-16 NOTE — Assessment & Plan Note (Signed)
Recently diagnosed,  Awaiting ophthalmology evaluation for corrective surgery

## 2017-11-16 NOTE — Assessment & Plan Note (Signed)
She has no history of hypertension but has had several elevated readings.  She has been asked to check her pressures at home and submit readings for evaluation. Renal function will be checked today  Lab Results  Component Value Date   CREATININE 0.80 11/15/2017   Lab Results  Component Value Date   NA 138 11/15/2017   K 4.4 11/15/2017   CL 104 11/15/2017   CO2 30 11/15/2017   No results found for: Derl Barrow

## 2017-11-16 NOTE — Assessment & Plan Note (Signed)
Encouraged to resume a daily exercise progrean that includes 30 minutes of cardio daily.  Goal wt loss is 10 lbs by next visit .  saxenda refilled

## 2017-11-16 NOTE — Assessment & Plan Note (Signed)
With panic attacks dramaticllally reduced with prn alprazolam prn and daily paxil. No changes today

## 2017-11-16 NOTE — Assessment & Plan Note (Signed)
Hepatic panel to be repeated prior to next visit.

## 2018-01-20 ENCOUNTER — Ambulatory Visit: Payer: BC Managed Care – PPO | Admitting: Family Medicine

## 2018-01-20 ENCOUNTER — Encounter: Payer: Self-pay | Admitting: Family Medicine

## 2018-01-20 VITALS — BP 128/78 | HR 92 | Temp 98.4°F | Ht 66.0 in | Wt 200.5 lb

## 2018-01-20 DIAGNOSIS — J069 Acute upper respiratory infection, unspecified: Secondary | ICD-10-CM | POA: Insufficient documentation

## 2018-01-20 DIAGNOSIS — J029 Acute pharyngitis, unspecified: Secondary | ICD-10-CM | POA: Diagnosis not present

## 2018-01-20 LAB — POCT RAPID STREP A (OFFICE): Rapid Strep A Screen: NEGATIVE

## 2018-01-20 NOTE — Progress Notes (Signed)
Subjective:    Patient ID: Christine Monroe, female    DOB: 04-27-1975, 42 y.o.   MRN: [id number]  HPI 42 yo pt of Dr Derrel Nip here for ST  Started getting sick yesterday  Worse last night- bad ST/hurts to swallow/ same today  Is able to drink fluids  No fever No rash   Some pnd  No sneezing or runny nose  Ears - L one hurt last night   Took mucinex over the counter for past 2 days  No pain relievers   Results for orders placed or performed in visit on 01/20/18  Rapid Strep A  Result Value Ref Range   Rapid Strep A Screen Negative Negative    Patient Active Problem List   Diagnosis Date Noted  . Viral URI 01/20/2018  . Lacrimal duct stenosis, bilateral 11/16/2017  . Elevated liver enzymes 11/16/2017  . Obesity (BMI 30-39.9) 10/22/2016  . Measles, mumps, rubella (MMR) vaccination status unknown 05/14/2015  . Encounter for preventive health examination 05/14/2015  . Stress incontinence in female 05/13/2015  . GERD (gastroesophageal reflux disease) 07/10/2014  . Chondromalacia, patella 02/15/2014  . Elevated blood pressure reading in office without diagnosis of hypertension 12/01/2012  . Generalized anxiety disorder 12/01/2012   Past Medical History:  Diagnosis Date  . Panic attacks 06/26/1999   since mother death   Past Surgical History:  Procedure Laterality Date  . CHOLECYSTECTOMY  26-Jun-2006   Social History   Tobacco Use  . Smoking status: Never Smoker  . Smokeless tobacco: Never Used  Substance Use Topics  . Alcohol use: No  . Drug use: No   Family History  Problem Relation Age of Onset  . Cancer Father   . Hypertension Father   . Aneurysm Mother 69       2001   No Known Allergies Current Outpatient Medications on File Prior to Visit  Medication Sig Dispense Refill  . ALPRAZolam (XANAX) 0.25 MG tablet Take 1 tablet (0.25 mg total) by mouth at bedtime as needed for sleep. 30 tablet 2  . Insulin Pen Needle (PEN NEEDLES) 31G X 6 MM MISC For use with victoza  /saxenda 100 each 0  . PARoxetine (PAXIL) 20 MG tablet TAKE 1 TABLET (20 MG TOTAL) BY MOUTH DAILY. 90 tablet 1  . SAXENDA 18 MG/3ML SOPN INJECT 3 MG INTO THE SKIN DAILY. (Patient not taking: Reported on 01/20/2018) 30 pen 0   No current facility-administered medications on file prior to visit.      Review of Systems  Constitutional: Positive for fatigue. Negative for appetite change and fever.  HENT: Positive for congestion, ear pain, postnasal drip, sneezing and sore throat. Negative for ear discharge, rhinorrhea and sinus pressure.   Eyes: Negative for pain and discharge.  Respiratory: Negative for cough, shortness of breath, wheezing and stridor.   Cardiovascular: Negative for chest pain.  Gastrointestinal: Negative for diarrhea, nausea and vomiting.  Genitourinary: Negative for frequency, hematuria and urgency.  Musculoskeletal: Negative for arthralgias and myalgias.  Skin: Negative for rash.  Neurological: Negative for dizziness, weakness, light-headedness and headaches.  Psychiatric/Behavioral: Negative for confusion and dysphoric mood.       Objective:   Physical Exam  Constitutional: She appears well-developed and well-nourished. No distress.  overwt and well app  HENT:  Head: Normocephalic and atraumatic.  Right Ear: External ear normal.  Left Ear: External ear normal.  Mouth/Throat: Oropharynx is clear and moist. No oropharyngeal exudate.  Nares are injected and congested  No sinus  tenderness Clear rhinorrhea and post nasal drip  Scant post throat injection    Eyes: Pupils are equal, round, and reactive to light. EOM are normal. Right eye exhibits no discharge. Left eye exhibits no discharge. No scleral icterus.  Mild conj injection  Neck: Normal range of motion. Neck supple.  Cardiovascular: Normal rate and normal heart sounds.  Pulmonary/Chest: Effort normal and breath sounds normal. No respiratory distress. She has no wheezes. She has no rales. She exhibits no  tenderness.  Lymphadenopathy:    She has no cervical adenopathy.  Neurological: She is alert.  Skin: Skin is warm and dry. No rash noted.  Psychiatric: She has a normal mood and affect.          Assessment & Plan:   Problem List Items Addressed This Visit      Respiratory   Viral URI - Primary    RST neg Reassuring exam Disc symptomatic care - see instructions on AVS (antihistamine/ analgesic)  Update if not starting to improve in a week or if worsening         Other Visit Diagnoses    Sore throat       Relevant Orders   Rapid Strep A (Completed)

## 2018-01-20 NOTE — Assessment & Plan Note (Signed)
RST neg Reassuring exam Disc symptomatic care - see instructions on AVS (antihistamine/ analgesic)  Update if not starting to improve in a week or if worsening

## 2018-01-20 NOTE — Patient Instructions (Addendum)
For post nasal drip and runny nose- antihistamine - allegra/ zyrtec /claritin over the counter  For congestion- mucinex  (if cough do mucinex DM) For sore throat- tylenol or ibuprofen  Drink lots of fluids Salt water gargles   Lozenges with zinc early in a cold may make it less severe   Update if not starting to improve in a week or if worsening   Drink fluids and try to get extra rest

## 2018-01-21 ENCOUNTER — Ambulatory Visit: Payer: BC Managed Care – PPO | Admitting: Family Medicine

## 2018-01-27 ENCOUNTER — Other Ambulatory Visit: Payer: Self-pay | Admitting: Internal Medicine

## 2018-02-18 ENCOUNTER — Ambulatory Visit: Payer: BC Managed Care – PPO | Admitting: Internal Medicine

## 2018-04-15 DIAGNOSIS — H04203 Unspecified epiphora, bilateral lacrimal glands: Secondary | ICD-10-CM | POA: Insufficient documentation

## 2018-04-15 DIAGNOSIS — H04549 Stenosis of unspecified lacrimal canaliculi: Secondary | ICD-10-CM | POA: Insufficient documentation

## 2018-04-15 HISTORY — DX: Unspecified epiphora, bilateral: H04.203

## 2018-05-17 ENCOUNTER — Encounter: Payer: Self-pay | Admitting: Internal Medicine

## 2018-05-17 ENCOUNTER — Encounter: Payer: BC Managed Care – PPO | Admitting: Internal Medicine

## 2018-05-17 ENCOUNTER — Ambulatory Visit: Payer: BC Managed Care – PPO | Admitting: Internal Medicine

## 2018-05-17 VITALS — BP 132/76 | HR 80 | Temp 98.5°F | Resp 15 | Wt 211.8 lb

## 2018-05-17 DIAGNOSIS — Z01818 Encounter for other preprocedural examination: Secondary | ICD-10-CM | POA: Diagnosis not present

## 2018-05-17 DIAGNOSIS — F411 Generalized anxiety disorder: Secondary | ICD-10-CM

## 2018-05-17 DIAGNOSIS — H04553 Acquired stenosis of bilateral nasolacrimal duct: Secondary | ICD-10-CM

## 2018-05-17 MED ORDER — ALPRAZOLAM 0.25 MG PO TABS: 0.2500 mg | ORAL_TABLET | Freq: Every evening | ORAL | 2 refills | Status: DC | PRN

## 2018-05-17 NOTE — Patient Instructions (Signed)
Good to see you!  I agree with your plan to lose weight.  Be patient ,  And be consistent!    Make sure that regular exercise is part of your plan  See you in 6 months

## 2018-05-17 NOTE — Progress Notes (Signed)
Subjective:  Patient ID: Christine Monroe, female    DOB: 1975-08-10  Age: 43 y.o. MRN: [id number]  CC: The primary encounter diagnosis was Preoperative evaluation of a medical condition to rule out surgical contraindications (TAR required). Diagnoses of Lacrimal duct stenosis, bilateral and Generalized anxiety disorder were also pertinent to this visit.  HPI: Christine Monroe presents for presurgical clearance  And medication refill. Last seen Nov 2018   She has a history of bilaterally blocked tearducts and is scheduled to have left lacrimal  stent placement following  left nasolacrimal probing by Dr Theodoro Kalata  On Feb 18 at [facility]  She underwent stenting of the  right tearduct last month    She has no history of diabetes,   CKD or CAD.  Denies chest pain,  Cough and shortness of breath   Using xanax for fear of flying .  Using paxil  Sees [facility] for PAP and mammogram .had endometrial ablation in Jan 2020  still not having periods   Obesity:   Weight gain noted.  Starting the dirty keto diet with family  Goal is 170 lbs    Outpatient Medications Prior to Visit  Medication Sig Dispense Refill  . PARoxetine (PAXIL) 20 MG tablet TAKE 1 TABLET (20 MG TOTAL) BY MOUTH DAILY. 90 tablet 1  . ALPRAZolam (XANAX) 0.25 MG tablet Take 1 tablet (0.25 mg total) by mouth at bedtime as needed for sleep. 30 tablet 2   No facility-administered medications prior to visit.     Review of Systems;  Patient denies headache, fevers, malaise, unintentional weight loss, skin rash, eye pain, sinus congestion and sinus pain, sore throat, dysphagia,  hemoptysis , cough, dyspnea, wheezing, chest pain, palpitations, orthopnea, edema, abdominal pain, nausea, melena, diarrhea, constipation, flank pain, dysuria, hematuria, urinary  Frequency, nocturia, numbness, tingling, seizures,  Focal weakness, Loss of consciousness,  Tremor, insomnia, depression, anxiety, and suicidal ideation.      Objective:   BP 132/76 (BP Location: Left Arm, Patient Position: Sitting, Cuff Size: Large)   Pulse 80   Temp 98.5 F (36.9 C) (Oral)   Resp 15   Wt 211 lb 12.8 oz (96.1 kg)   SpO2 98%   BMI 34.19 kg/m   BP Readings from Last 3 Encounters:  05/17/18 132/76  05/17/18 132/76  01/20/18 128/78    Wt Readings from Last 3 Encounters:  05/17/18 211 lb 12.8 oz (96.1 kg)  05/17/18 211 lb 12.8 oz (96.1 kg)  01/20/18 200 lb 8 oz (90.9 kg)    General appearance: alert, cooperative and appears stated age Ears: normal TM's and external ear canals both ears Throat: lips, mucosa, and tongue normal; teeth and gums normal Neck: no adenopathy, no carotid bruit, supple, symmetrical, trachea midline and thyroid not enlarged, symmetric, no tenderness/mass/nodules Back: symmetric, no curvature. ROM normal. No CVA tenderness. Lungs: clear to auscultation bilaterally Heart: regular rate and rhythm, S1, S2 normal, no murmur, click, rub or gallop Abdomen: soft, non-tender; bowel sounds normal; no masses,  no organomegaly Pulses: 2+ and symmetric Skin: Skin color, texture, turgor normal. No rashes or lesions Lymph nodes: Cervical, supraclavicular, and axillary nodes normal.  No results found for: HGBA1C  Lab Results  Component Value Date   CREATININE 0.78 05/17/2018   CREATININE 0.80 11/15/2017   CREATININE 0.75 02/17/2017    Lab Results  Component Value Date   WBC 9.5 02/17/2017   HGB 13.6 02/17/2017   HCT 41.2 02/17/2017   PLT 292.0  02/17/2017   GLUCOSE 75 05/17/2018   CHOL 143 05/13/2015   TRIG 98.0 05/13/2015   HDL 52.70 05/13/2015   LDLCALC 71 05/13/2015   ALT 18 05/17/2018   AST 18 05/17/2018   NA 139 05/17/2018   K 4.0 05/17/2018   CL 102 05/17/2018   CREATININE 0.78 05/17/2018   BUN 11 05/17/2018   CO2 24 05/17/2018   TSH 1.51 05/17/2018    Assessment & Plan:   Problem List Items Addressed This Visit    Preoperative evaluation of a medical condition to rule out surgical  contraindications (TAR required) - Primary    Patient  is considered to be at low risk  For perioperative complications  Based on today's exam and history.  Baseline lytes,  hgb and ekg done. I have ordered and reviewed a 12 lead EKG and find that there are no acute changes and patient is in sinus rhythm.        Relevant Orders   Comprehensive metabolic panel (Completed)   TSH (Completed)   EKG 12-Lead (Completed)   Lacrimal duct stenosis, bilateral    She is scheduled for elective surgery . Based on today's exam, she  is considered low risk fr perioperative complications      Generalized anxiety disorder    With panic attacks dramaticllally reduced with prn alprazolam prn and daily paxil. No changes today . The risks and benefits of benzodiazepine use were discussed with patient today including excessive sedation leading to respiratory depression,  impaired thinking/driving, and addiction.  Patient was advised to avoid concurrent use with alcohol, to use medication only as needed and not to share with others  .       Relevant Medications   ALPRAZolam (XANAX) 0.25 MG tablet      I am having Christine Monroe maintain her PARoxetine and ALPRAZolam.  Meds ordered this encounter  Medications  . ALPRAZolam (XANAX) 0.25 MG tablet    Sig: Take 1 tablet (0.25 mg total) by mouth at bedtime as needed for sleep.    Dispense:  30 tablet    Refill:  2    Medications Discontinued During This Encounter  Medication Reason  . ALPRAZolam (XANAX) 0.25 MG tablet Reorder   A total of 25 minutes of face to face time was spent with patient more than half of which was spent in counselling about the above mentioned conditions  and coordination of care  Follow-up: No follow-ups on file.   Crecencio Mc, MD

## 2018-05-18 ENCOUNTER — Encounter: Payer: Self-pay | Admitting: Internal Medicine

## 2018-05-18 LAB — COMPREHENSIVE METABOLIC PANEL
ALBUMIN: 4.5 g/dL (ref 3.5–5.2)
ALK PHOS: 78 U/L (ref 39–117)
ALT: 18 U/L (ref 0–35)
AST: 18 U/L (ref 0–37)
BUN: 11 mg/dL (ref 6–23)
CALCIUM: 9.3 mg/dL (ref 8.4–10.5)
CHLORIDE: 102 meq/L (ref 96–112)
CO2: 24 mEq/L (ref 19–32)
Creatinine, Ser: 0.78 mg/dL (ref 0.40–1.20)
GFR: 79.94 mL/min (ref >60.00)
Glucose, Bld: 75 mg/dL (ref 70–99)
Potassium: 4 mEq/L (ref 3.5–5.1)
SODIUM: 139 meq/L (ref 135–145)
TOTAL PROTEIN: 7.5 g/dL (ref 6.0–8.3)
Total Bilirubin: 0.4 mg/dL (ref 0.2–1.2)

## 2018-05-18 LAB — TSH: TSH: 1.51 u[IU]/mL (ref 0.35–4.50)

## 2018-05-18 NOTE — Assessment & Plan Note (Signed)
She is scheduled for elective surgery . Based on today's exam, she  is considered low risk fr perioperative complications

## 2018-05-18 NOTE — Assessment & Plan Note (Addendum)
With panic attacks dramaticllally reduced with prn alprazolam prn and daily paxil. No changes today . The risks and benefits of benzodiazepine use were discussed with patient today including excessive sedation leading to respiratory depression,  impaired thinking/driving, and addiction.  Patient was advised to avoid concurrent use with alcohol, to use medication only as needed and not to share with others  .

## 2018-05-18 NOTE — Assessment & Plan Note (Signed)
Patient  is considered to be at low risk  For perioperative complications  Based on today's exam and history.  Baseline lytes,  hgb and ekg done. \I have ordered and reviewed a 12 lead EKG and find that there are no acute changes and patient is in sinus rhythm.   

## 2018-07-11 ENCOUNTER — Other Ambulatory Visit: Payer: Self-pay | Admitting: Internal Medicine

## 2018-12-06 ENCOUNTER — Other Ambulatory Visit: Payer: Self-pay | Admitting: Internal Medicine

## 2018-12-06 NOTE — Telephone Encounter (Signed)
Refilled: 05/17/2018 Last OV: 05/17/2018 Next OV: not scheduled

## 2019-01-13 ENCOUNTER — Other Ambulatory Visit: Payer: Self-pay | Admitting: Internal Medicine

## 2019-01-25 DIAGNOSIS — D239 Other benign neoplasm of skin, unspecified: Secondary | ICD-10-CM

## 2019-01-25 DIAGNOSIS — D229 Melanocytic nevi, unspecified: Secondary | ICD-10-CM

## 2019-01-25 HISTORY — DX: Melanocytic nevi, unspecified: D22.9

## 2019-01-25 HISTORY — DX: Other benign neoplasm of skin, unspecified: D23.9

## 2019-05-29 LAB — HM MAMMOGRAPHY

## 2019-07-03 ENCOUNTER — Telehealth: Payer: Self-pay | Admitting: Internal Medicine

## 2019-07-03 MED ORDER — TRIAMCINOLONE ACETONIDE 0.1 % EX CREA: 1.0000 "application " | TOPICAL_CREAM | Freq: Two times a day (BID) | CUTANEOUS | 0 refills | Status: DC

## 2019-07-03 MED ORDER — PREDNISONE 10 MG PO TABS: ORAL_TABLET | ORAL | 0 refills | Status: DC

## 2019-07-03 NOTE — Telephone Encounter (Signed)
No available appts for today.

## 2019-07-03 NOTE — Addendum Note (Signed)
Addended by: Crecencio Mc on: 07/03/2019 12:30 PM   Modules accepted: Orders

## 2019-07-03 NOTE — Telephone Encounter (Signed)
Pt called she is wanting something called in for poison oak around her eye

## 2019-08-14 ENCOUNTER — Other Ambulatory Visit: Payer: Self-pay | Admitting: Internal Medicine

## 2019-08-27 DIAGNOSIS — G8929 Other chronic pain: Secondary | ICD-10-CM | POA: Insufficient documentation

## 2019-09-06 ENCOUNTER — Other Ambulatory Visit: Payer: Self-pay

## 2019-09-06 ENCOUNTER — Encounter: Payer: Self-pay | Admitting: Nurse Practitioner

## 2019-09-06 ENCOUNTER — Telehealth (INDEPENDENT_AMBULATORY_CARE_PROVIDER_SITE_OTHER): Payer: BC Managed Care – PPO | Admitting: Nurse Practitioner

## 2019-09-06 ENCOUNTER — Telehealth: Payer: Self-pay | Admitting: Nurse Practitioner

## 2019-09-06 VITALS — Temp 97.2°F | Ht 66.0 in | Wt 197.0 lb

## 2019-09-06 DIAGNOSIS — J309 Allergic rhinitis, unspecified: Secondary | ICD-10-CM

## 2019-09-06 DIAGNOSIS — H02843 Edema of right eye, unspecified eyelid: Secondary | ICD-10-CM | POA: Insufficient documentation

## 2019-09-06 DIAGNOSIS — H0289 Other specified disorders of eyelid: Secondary | ICD-10-CM | POA: Diagnosis not present

## 2019-09-06 NOTE — Telephone Encounter (Signed)
I sent her the AVS by My Chart.

## 2019-09-06 NOTE — Patient Instructions (Signed)
  Pain and leg swelling of eyelid of right eye  Allergic rhinitis, unspecified seasonality, unspecified trigger  I advise an in-person evaluation of your eye and can go to the [facility] now. She could also contact her eye doctor for an exam this morning.  I would not wait for an appt tomorrow.  Do not place contacts in the eyes. Do not put eye drops in the eyes. OK to stay on Zyrtec.

## 2019-09-06 NOTE — Progress Notes (Signed)
Virtual Visit via  Note  This visit type was conducted due to national recommendations for restrictions regarding the COVID-19 pandemic (e.g. social distancing).  This format is felt to be most appropriate for this patient at this time.  All issues noted in this document were discussed and addressed.  No physical exam was performed (except for noted visual exam findings with Video Visits).   I connected with Christine Monroe on 09/06/19 at  8:30 AM EDT by a video enabled telemedicine application or telephone and verified that I am speaking with the correct person using two identifiers.  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 Location provider: work  office Persons participating in the virtual visit: patient, provider  I discussed the limitations, risks, security and privacy concerns of performing an evaluation and management service by telephone and the availability of in person appointments. I also discussed with the patient that there may be a patient responsible charge related to this service. The patient expressed understanding and agreed to proceed.   Reason for visit: right eye pain/swelling onset yesterday  HPI: This 44 year old patient with history of allergic rhinitis, lacrimal duct stenosis, reports she was busy outdoors this weekend putting mulch out on Saturday and Sunday.  Her allergies were bothering her over the weekend and her nose was running.  This is not unusual.  She takes Zyrtec daily.  She was not aware of getting anything in her eye.Her left eye runs all of the time despite her tear duct surgery last year. The right eye is not as runny. She had no eye irritation until Tuesday morning.  She noted tenderness in the lateral right upper eyelid area, and it was mildly swollen.  She took Benadryl Tuesday night and woke up this morning with a much more swollen right eye upper lid with redness and tenderness.   She reports no pain in the eye itself, but the upper and lower  eyelids are tender.  Her vision is okay. She is not wearing contacts. She had a mild headache on Monday.  She had a slight sore throat from PND and that has resolved. No sinus congestion, purulent discharge, facial pressure, current HA, or cough. She denies fevers or chills or malaise.  She has not had the Covid vaccine. No personal hx of  Covid virus or known exposures.   ROS: See pertinent positives and negatives per HPI.  Past Medical History:  Diagnosis Date  . Panic attacks 2001   since mother death    Past Surgical History:  Procedure Laterality Date  . CHOLECYSTECTOMY  2008    Family History  Problem Relation Age of Onset  . Cancer Father   . Hypertension Father   . Aneurysm Mother 54       20 01    SOCIAL HX: never smoked   Current Outpatient Medications:  .  ALPRAZolam (XANAX) 0.25 MG tablet, TAKE 1 TABLET (0.25 MG TOTAL) BY MOUTH AT BEDTIME AS NEEDED FOR SLEEP., Disp: 30 tablet, Rfl: 0 .  meloxicam (MOBIC) 15 MG tablet, Take 15 mg by mouth daily., Disp: , Rfl:  .  PARoxetine (PAXIL) 20 MG tablet, TAKE 1 TABLET BY MOUTH EVERY DAY, Disp: 90 tablet, Rfl: 1  EXAM:  VITALS: 97.2, wt 197  GENERAL: alert, oriented, appears well and in no acute distress  HEENT: atraumatic, right eye-upper lid is pink, swollen and conjunctiva is poorly visualized with the video feed. She does feel any warmth. No  obvious nasal congestion.   NECK: normal movements of  the head and neck  LUNGS: on inspection no signs of respiratory distress, breathing rate appears normal, no obvious gross SOB, gasping or wheezing  CV: no obvious cyanosis  MS: moves all visible extremities without noticeable abnormality  PSYCH/NEURO: pleasant and cooperative, no obvious depression or anxiety, speech and thought processing grossly intact  ASSESSMENT AND PLAN:  Discussed the following assessment and plan:  Pain and swelling of eyelid of right eye  Allergic rhinitis, unspecified seasonality,  unspecified trigger  I advise an in-person evaluation of her eye and she can go to the [facility] clinic now. She could also contact her eye doctor for an exam this morning.  I would not wait for an appt tomorrow.  Do not place contacts in the eyes. Do not put eye drops in the eyes. OK to stay on Zyrtec.   No problem-specific Assessment & Plan notes found for this encounter.  I discussed the assessment and treatment plan with the patient. The patient was provided an opportunity to ask questions and all were answered. The patient agreed with the plan and demonstrated an understanding of the instructions.   The patient was advised to call back or seek an in-person evaluation if the symptoms worsen or if the condition fails to improve as anticipated.  I provided 10  minutes of non-face-to-face time during this encounter.  Denice Paradise, ANP

## 2019-10-17 ENCOUNTER — Other Ambulatory Visit: Payer: Self-pay | Admitting: Internal Medicine

## 2019-10-17 DIAGNOSIS — L247 Irritant contact dermatitis due to plants, except food: Secondary | ICD-10-CM | POA: Insufficient documentation

## 2019-10-17 HISTORY — DX: Irritant contact dermatitis due to plants, except food: L24.7

## 2019-10-17 MED ORDER — PREDNISONE 10 MG PO TABS: ORAL_TABLET | ORAL | 0 refills | Status: DC

## 2019-10-17 NOTE — Assessment & Plan Note (Signed)
Prednisone taper sent to [facility]

## 2019-11-23 MED ORDER — PREDNISONE 10 MG PO TABS: ORAL_TABLET | ORAL | 0 refills | Status: DC

## 2019-11-23 MED ORDER — TRIAMCINOLONE ACETONIDE 0.1 % EX CREA: 1.0000 "application " | TOPICAL_CREAM | Freq: Two times a day (BID) | CUTANEOUS | 0 refills | Status: DC

## 2020-02-07 ENCOUNTER — Other Ambulatory Visit: Payer: Self-pay

## 2020-02-07 ENCOUNTER — Ambulatory Visit: Payer: BC Managed Care – PPO | Admitting: Dermatology

## 2020-02-07 ENCOUNTER — Encounter: Payer: Self-pay | Admitting: Dermatology

## 2020-02-07 DIAGNOSIS — D229 Melanocytic nevi, unspecified: Secondary | ICD-10-CM

## 2020-02-07 DIAGNOSIS — L988 Other specified disorders of the skin and subcutaneous tissue: Secondary | ICD-10-CM | POA: Diagnosis not present

## 2020-02-07 DIAGNOSIS — D485 Neoplasm of uncertain behavior of skin: Secondary | ICD-10-CM

## 2020-02-07 DIAGNOSIS — D1800 Hemangioma unspecified site: Secondary | ICD-10-CM

## 2020-02-07 DIAGNOSIS — L814 Other melanin hyperpigmentation: Secondary | ICD-10-CM

## 2020-02-07 DIAGNOSIS — Z1283 Encounter for screening for malignant neoplasm of skin: Secondary | ICD-10-CM | POA: Diagnosis not present

## 2020-02-07 DIAGNOSIS — D492 Neoplasm of unspecified behavior of bone, soft tissue, and skin: Secondary | ICD-10-CM

## 2020-02-07 DIAGNOSIS — L821 Other seborrheic keratosis: Secondary | ICD-10-CM

## 2020-02-07 DIAGNOSIS — L578 Other skin changes due to chronic exposure to nonionizing radiation: Secondary | ICD-10-CM

## 2020-02-07 DIAGNOSIS — L700 Acne vulgaris: Secondary | ICD-10-CM | POA: Diagnosis not present

## 2020-02-07 DIAGNOSIS — Z86018 Personal history of other benign neoplasm: Secondary | ICD-10-CM | POA: Diagnosis not present

## 2020-02-07 MED ORDER — SPIRONOLACTONE 25 MG PO TABS: 25.0000 mg | ORAL_TABLET | Freq: Every morning | ORAL | 3 refills | Status: DC

## 2020-02-07 NOTE — Progress Notes (Signed)
Follow-Up Visit   Subjective  Christine Monroe is a 43 y.o. female who presents for the following: Annual Exam (Total body skin exam, hx of dysplastic nevus L side above the waistline) and check spot (base of neck/chest, started as a pimple now it will not go away). The patient presents for Total-Body Skin Exam (TBSE) for skin cancer screening and mole check.  The following portions of the chart were reviewed this encounter and updated as appropriate:  Tobacco  Allergies  Meds  Problems  Med Hx  Surg Hx  Fam Hx     Review of Systems:  No other skin or systemic complaints except as noted in HPI or Assessment and Plan.  Objective  Well appearing patient in no apparent distress; mood and affect are within normal limits.  A full examination was performed including scalp, head, eyes, ears, nose, lips, neck, chest, axillae, abdomen, back, buttocks, bilateral upper extremities, bilateral lower extremities, hands, feet, fingers, toes, fingernails, and toenails. All findings within normal limits unless otherwise noted below.  Objective  Left side above the waistline: Scar with no evidence of recurrence.   Objective  Right of midline anterior base of neck: 1.0cm firm scaly pink pap     Objective  Left superior Breast: 2.33mm brown macule     Objective  face: Rhytides and volume loss.   Objective  Neck - Posterior, face: 0.5cm pap L post base of neck   Assessment & Plan    Lentigines - Scattered tan macules - Discussed due to sun exposure - Benign, observe - Call for any changes  Seborrheic Keratoses - Stuck-on, waxy, tan-brown papules and plaques  - Discussed benign etiology and prognosis. - Observe - Call for any changes  Melanocytic Nevi - Tan-brown and/or pink-flesh-colored symmetric macules and papules - Benign appearing on exam today - Observation - Call clinic for new or changing moles - Recommend daily use of broad spectrum spf 30+ sunscreen to  sun-exposed areas.   Hemangiomas - Red papules - Discussed benign nature - Observe - Call for any changes  Actinic Damage -chronic - diffuse scaly erythematous macules with underlying dyspigmentation - Recommend daily broad spectrum sunscreen SPF 30+ to sun-exposed areas, reapply every 2 hours as needed.  - Call for new or changing lesions.  Skin cancer screening performed today.  History of dysplastic nevus Left side above the waistline Clear. Observe for recurrence. Call clinic for new or changing lesions.  Recommend regular skin exams, daily broad-spectrum spf 30+ sunscreen use, and photoprotection.     Neoplasm of skin (2) Right of midline anterior base of neck  Epidermal / dermal shaving  Lesion diameter (cm):  1 Informed consent: discussed and consent obtained   Timeout: patient name, date of birth, surgical site, and procedure verified   Procedure prep:  Patient was prepped and draped in usual sterile fashion Prep type:  Isopropyl alcohol Anesthesia: the lesion was anesthetized in a standard fashion   Anesthetic:  1% lidocaine w/ epinephrine 1-100,000 buffered w/ 8.4% NaHCO3 Instrument used: flexible razor blade   Hemostasis achieved with: pressure, aluminum chloride and electrodesiccation   Outcome: patient tolerated procedure well   Post-procedure details: sterile dressing applied and wound care instructions given   Dressing type: bandage and petrolatum    Destruction of lesion Complexity: extensive   Destruction method: electrodesiccation and curettage   Informed consent: discussed and consent obtained   Timeout:  patient name, date of birth, surgical site, and procedure verified Procedure prep:  Patient was prepped and draped in usual sterile fashion Prep type:  Isopropyl alcohol Anesthesia: the lesion was anesthetized in a standard fashion   Anesthetic:  1% lidocaine w/ epinephrine 1-100,000 buffered w/ 8.4% NaHCO3 Curettage performed in three different  directions: Yes   Electrodesiccation performed over the curetted area: Yes   Lesion length (cm):  1 Lesion width (cm):  1 Margin per side (cm):  0.2 Final wound size (cm):  1.4 Hemostasis achieved with:  pressure, aluminum chloride and electrodesiccation Outcome: patient tolerated procedure well with no complications   Post-procedure details: sterile dressing applied and wound care instructions given   Dressing type: bandage and petrolatum    Specimen 1 - Surgical pathology Differential Diagnosis: D48.5 AK r/o CA vs other Check Margins: No 1.0cm firm scaly pink pap EDC today  Left superior Breast  Epidermal / dermal shaving  Lesion diameter (cm):  0.2 Informed consent: discussed and consent obtained   Timeout: patient name, date of birth, surgical site, and procedure verified   Procedure prep:  Patient was prepped and draped in usual sterile fashion Prep type:  Isopropyl alcohol Anesthesia: the lesion was anesthetized in a standard fashion   Anesthetic:  1% lidocaine w/ epinephrine 1-100,000 buffered w/ 8.4% NaHCO3 Instrument used: flexible razor blade   Hemostasis achieved with: pressure, aluminum chloride and electrodesiccation   Outcome: patient tolerated procedure well   Post-procedure details: sterile dressing applied and wound care instructions given   Dressing type: bandage and petrolatum    Specimen 2 - Surgical pathology Differential Diagnosis: D48.5 Nevus vs Dysplastic Nevus Check Margins: yes 2.66mm brown macule  Benign appearing, observe.    Elastosis of skin face Discussed Alastin products  Acne vulgaris - chronic - new problem Neck - Posterior, face Start Spironolactone 25mg  1 po qam  Spironolactone can cause increased urination and cause blood pressure to decrease. Please watch for signs of lightheadedness and be cautious when changing position. It can sometimes cause breast tenderness or an irregular period in premenopausal women. It can also increase  potassium. The increase in potassium usually is not a concern unless you are taking other medicines that also increase potassium, so please be sure your doctor knows all of the other medications you are taking. This medication should not be taken by pregnant women.  This medicine should also not be taken together with sulfa drugs like Bactrim (trimethoprim/sulfamethexazole).    spironolactone (ALDACTONE) 25 MG tablet - Neck - Posterior, face  Return in about 3 months (around 05/09/2020) for Acne/spironolactone.   I, Othelia Pulling, RMA, am acting as scribe for Sarina Ser, MD .  Documentation: I have reviewed the above documentation for accuracy and completeness, and I agree with the above.  Sarina Ser, MD

## 2020-02-07 NOTE — Patient Instructions (Signed)

## 2020-02-08 DIAGNOSIS — Z1211 Encounter for screening for malignant neoplasm of colon: Secondary | ICD-10-CM

## 2020-02-15 ENCOUNTER — Telehealth: Payer: Self-pay

## 2020-02-15 NOTE — Telephone Encounter (Signed)
Patient informed of pathology results 

## 2020-02-15 NOTE — Telephone Encounter (Signed)
-----   Message from Ralene Bathe, MD sent at 02/13/2020  7:05 PM EDT ----- Diagnosis 1. Skin , right of midline anterior base of neck VERRUCA VULGARIS, IRRITATED 2. Skin , left superior breast DYSPLASTIC COMPOUND NEVUS WITH MODERATE ATYPIA, CLOSE TO MARGIN  1- benign viral wart May recur 2- dysplastic Moderate Recheck next visit Keep follow up appt

## 2020-03-03 ENCOUNTER — Other Ambulatory Visit: Payer: Self-pay | Admitting: Internal Medicine

## 2020-04-18 DIAGNOSIS — M94261 Chondromalacia, right knee: Secondary | ICD-10-CM | POA: Diagnosis not present

## 2020-04-18 DIAGNOSIS — M2341 Loose body in knee, right knee: Secondary | ICD-10-CM | POA: Diagnosis not present

## 2020-04-18 DIAGNOSIS — M23261 Derangement of other lateral meniscus due to old tear or injury, right knee: Secondary | ICD-10-CM | POA: Diagnosis not present

## 2020-04-18 DIAGNOSIS — S83231A Complex tear of medial meniscus, current injury, right knee, initial encounter: Secondary | ICD-10-CM | POA: Diagnosis not present

## 2020-04-18 DIAGNOSIS — Z20822 Contact with and (suspected) exposure to covid-19: Secondary | ICD-10-CM | POA: Diagnosis not present

## 2020-04-18 DIAGNOSIS — M659 Synovitis and tenosynovitis, unspecified: Secondary | ICD-10-CM | POA: Diagnosis not present

## 2020-04-18 DIAGNOSIS — X58XXXA Exposure to other specified factors, initial encounter: Secondary | ICD-10-CM | POA: Diagnosis not present

## 2020-04-18 DIAGNOSIS — S83281A Other tear of lateral meniscus, current injury, right knee, initial encounter: Secondary | ICD-10-CM | POA: Diagnosis not present

## 2020-04-18 DIAGNOSIS — M1711 Unilateral primary osteoarthritis, right knee: Secondary | ICD-10-CM | POA: Diagnosis not present

## 2020-04-18 DIAGNOSIS — S83206A Unspecified tear of unspecified meniscus, current injury, right knee, initial encounter: Secondary | ICD-10-CM | POA: Diagnosis not present

## 2020-04-25 DIAGNOSIS — S83206D Unspecified tear of unspecified meniscus, current injury, right knee, subsequent encounter: Secondary | ICD-10-CM | POA: Diagnosis not present

## 2020-04-25 DIAGNOSIS — M2341 Loose body in knee, right knee: Secondary | ICD-10-CM | POA: Diagnosis not present

## 2020-04-28 ENCOUNTER — Other Ambulatory Visit: Payer: Self-pay | Admitting: Dermatology

## 2020-04-28 DIAGNOSIS — L700 Acne vulgaris: Secondary | ICD-10-CM

## 2020-05-21 NOTE — Telephone Encounter (Signed)
Called and spoke to Christine Monroe. She states that she has not received the Cologuard placed by Dr. Derrel Nip on 02/08/2020 and she has submitted her new insurance card that was requested by Janett Billow on 05/07/2020. The New insurance card is listed under Media. A Cologuard has been ordered through the online webportal for [employer].   Christine Monroe asks to be started on a new medication for weight loss. She states that it is Topamax. Scheduled her an appointment on 06/11/2020 to discuss the new medication.

## 2020-05-29 ENCOUNTER — Other Ambulatory Visit: Payer: Self-pay

## 2020-05-29 ENCOUNTER — Ambulatory Visit: Payer: BLUE CROSS/BLUE SHIELD | Admitting: Dermatology

## 2020-05-29 DIAGNOSIS — B009 Herpesviral infection, unspecified: Secondary | ICD-10-CM

## 2020-05-29 DIAGNOSIS — L820 Inflamed seborrheic keratosis: Secondary | ICD-10-CM

## 2020-05-29 DIAGNOSIS — B078 Other viral warts: Secondary | ICD-10-CM | POA: Diagnosis not present

## 2020-05-29 DIAGNOSIS — L700 Acne vulgaris: Secondary | ICD-10-CM

## 2020-05-29 MED ORDER — SPIRONOLACTONE 25 MG PO TABS: 25.0000 mg | ORAL_TABLET | Freq: Two times a day (BID) | ORAL | 2 refills | Status: DC

## 2020-05-29 MED ORDER — VALACYCLOVIR HCL 1 G PO TABS: ORAL_TABLET | ORAL | 11 refills | Status: AC

## 2020-05-29 NOTE — Patient Instructions (Addendum)
Start Valacyclovir 1 gm take 2 tablets at first sign of flare repeat in 12 hours, or take 1 tablet twice a day for 5 days   Spironolactone can cause increased urination and cause blood pressure to decrease. Please watch for signs of lightheadedness and be cautious when changing position. It can sometimes cause breast tenderness or an irregular period in premenopausal women. It can also increase potassium. The increase in potassium usually is not a concern unless you are taking other medicines that also increase potassium, so please be sure your doctor knows all of the other medications you are taking. This medication should not be taken by pregnant women.  This medicine should also not be taken together with sulfa drugs like Bactrim (trimethoprim/sulfamethexazole).

## 2020-05-29 NOTE — Progress Notes (Signed)
Follow-Up Visit   Subjective  Christine Monroe is a 45 y.o. female who presents for the following: Acne (4 months f/u on Acne on face, neck pt taking Spironolactone 25 mg daily with a good response ), Skin Problem (Pt c/o recurrent fever blisters on her upper lip, she has never treated fever blisters ), Warts (Biopsy proven wart on the R of midline anterior base of neck biopsied 02/07/20), and growth  (Pt c/o irritated growth on the R preauricular she would like removed today ).  The following portions of the chart were reviewed this encounter and updated as appropriate:   Tobacco  Allergies  Meds  Problems  Med Hx  Surg Hx  Fam Hx     Review of Systems:  No other skin or systemic complaints except as noted in HPI or Assessment and Plan.  Objective  Well appearing patient in no apparent distress; mood and affect are within normal limits.  A focused examination was performed including face, neck, . Relevant physical exam findings are noted in the Assessment and Plan.  Objective  Neck - Anterior: Verrucous papules -- Discussed viral etiology and contagion.   Objective  face, posterior neck: Few pink papules   BP 157/79  Objective  upper lip: Blister   Objective  R preauricular: Erythematous keratotic or waxy stuck-on papule or plaque.    Assessment & Plan  Other viral warts Neck - Anterior Biopsy proven wart- recurrent  Recommend LN2 today   Destruction of lesion - Neck - Anterior Complexity: simple   Destruction method: cryotherapy   Informed consent: discussed and consent obtained   Timeout:  patient name, date of birth, surgical site, and procedure verified Lesion destroyed using liquid nitrogen: Yes   Region frozen until ice ball extended beyond lesion: Yes   Outcome: patient tolerated procedure well with no complications   Post-procedure details: wound care instructions given    Acne vulgaris - chronic; Persistent; Not to goal face, posterior  neck Increase to Spironolactone 25 mg take 2 tablets daily   Spironolactone can cause increased urination and cause blood pressure to decrease. Please watch for signs of lightheadedness and be cautious when changing position. It can sometimes cause breast tenderness or an irregular period in premenopausal women. It can also increase potassium. The increase in potassium usually is not a concern unless you are taking other medicines that also increase potassium, so please be sure your doctor knows all of the other medications you are taking. This medication should not be taken by pregnant women.  This medicine should also not be taken together with sulfa drugs like Bactrim (trimethoprim/sulfamethexazole).    Reordered Medications spironolactone (ALDACTONE) 25 MG tablet  Herpes simplex upper lip Herpes Simplex Virus = Cold Sores = Fever Blisters is a chronic recurring blistering; scabbing sore-producing viral infection that is recurrent usually in the same area triggered by stress, sun/UV exposure and trauma.  It is infectious and can be spread from person to person by direct contact.  It is not curable, but is treatable with topical and oral medication.   Start Valacyclovir 1 gm take 2 tablets at first sign of flare repeat in 12 hours, or take 1 tablet twice a day for 5 days   Ordered Medications: valACYclovir (VALTREX) 1000 MG tablet  Inflamed seborrheic keratosis R preauricular ISK vs wart   Destruction of lesion - R preauricular Complexity: simple   Destruction method: cryotherapy   Informed consent: discussed and consent obtained   Timeout:  patient name, date of birth, surgical site, and procedure verified Lesion destroyed using liquid nitrogen: Yes   Region frozen until ice ball extended beyond lesion: Yes   Outcome: patient tolerated procedure well with no complications   Post-procedure details: wound care instructions given    Return in about 3 months (around 08/26/2020) for acne  .  IMarye Round, CMA, am acting as scribe for Sarina Ser, MD .  Documentation: I have reviewed the above documentation for accuracy and completeness, and I agree with the above.  Sarina Ser, MD

## 2020-05-30 ENCOUNTER — Encounter: Payer: Self-pay | Admitting: Dermatology

## 2020-06-11 ENCOUNTER — Encounter: Payer: Self-pay | Admitting: Internal Medicine

## 2020-06-11 ENCOUNTER — Other Ambulatory Visit: Payer: Self-pay

## 2020-06-11 ENCOUNTER — Ambulatory Visit: Payer: BLUE CROSS/BLUE SHIELD | Admitting: Internal Medicine

## 2020-06-11 VITALS — BP 100/70 | HR 80 | Temp 97.4°F | Ht 66.0 in | Wt 210.1 lb

## 2020-06-11 DIAGNOSIS — Z23 Encounter for immunization: Secondary | ICD-10-CM | POA: Diagnosis not present

## 2020-06-11 DIAGNOSIS — Z1211 Encounter for screening for malignant neoplasm of colon: Secondary | ICD-10-CM | POA: Diagnosis not present

## 2020-06-11 DIAGNOSIS — R5383 Other fatigue: Secondary | ICD-10-CM

## 2020-06-11 DIAGNOSIS — R030 Elevated blood-pressure reading, without diagnosis of hypertension: Secondary | ICD-10-CM

## 2020-06-11 DIAGNOSIS — E669 Obesity, unspecified: Secondary | ICD-10-CM | POA: Diagnosis not present

## 2020-06-11 DIAGNOSIS — Z800 Family history of malignant neoplasm of digestive organs: Secondary | ICD-10-CM

## 2020-06-11 DIAGNOSIS — H04553 Acquired stenosis of bilateral nasolacrimal duct: Secondary | ICD-10-CM

## 2020-06-11 NOTE — Progress Notes (Signed)
Subjective:  Patient ID: Christine Monroe, female    DOB: 05-Sep-1975  Age: 45 y.o. MRN: [id number]  CC: The primary encounter diagnosis was Obesity (BMI 30-39.9). Diagnoses of Fatigue, unspecified type, Need for Tdap vaccination, Colon cancer screening, Lacrimal duct stenosis, bilateral, and Elevated blood pressure reading in office without diagnosis of hypertension were also pertinent to this visit.  HPI Christine Monroe presents for FOLLOW UP ON obesity.  Last  seen 2 years ago.  This visit occurred during the SARS-CoV-2 public health emergency.  Safety protocols were in place, including screening questions prior to the visit, additional usage of staff PPE, and extensive cleaning of exam room while observing appropriate contact time as indicated for disinfecting solutions.   Patient wants to lose 60 lbs  In total. In past years she did not tolerate phentermine in the past due to increased anxeite.  Wants to try topomax,  But started the optavia diet yesterday  Not exercising yet due to having  MENISCAL TEAR SURGERY on the right knee one month ago right knee by bowers   Outpatient Medications Prior to Visit  Medication Sig Dispense Refill  . ALPRAZolam (XANAX) 0.25 MG tablet TAKE 1 TABLET (0.25 MG TOTAL) BY MOUTH AT BEDTIME AS NEEDED FOR SLEEP. 30 tablet 0  . PARoxetine (PAXIL) 20 MG tablet TAKE 1 TABLET BY MOUTH EVERY DAY 90 tablet 1  . spironolactone (ALDACTONE) 25 MG tablet Take 1 tablet (25 mg total) by mouth 2 (two) times daily. 60 tablet 2  . triamcinolone cream (KENALOG) 0.1 % Apply 1 application topically 2 (two) times daily. 80 g 0  . valACYclovir (VALTREX) 1000 MG tablet Start Valacyclovir 1 gm take 2 tablets at first sign of flare repeat in 12 hours, or take 1 tablet twice a day for 5 days 30 tablet 11  . meloxicam (MOBIC) 15 MG tablet Take 15 mg by mouth daily. (Patient not taking: Reported on 06/11/2020)    . predniSONE (DELTASONE) 10 MG tablet 6 tablets daily for 3 days, then reduce  by 1 tablet daily until gone (Patient not taking: Reported on 06/11/2020) 33 tablet 0  . predniSONE (DELTASONE) 10 MG tablet 6 tablets on Day 1 , then reduce by 1 tablet daily until gone (Patient not taking: Reported on 06/11/2020) 21 tablet 0   No facility-administered medications prior to visit.    Review of Systems;  Patient denies headache, fevers, malaise, unintentional weight loss, skin rash, eye pain, sinus congestion and sinus pain, sore throat, dysphagia,  hemoptysis , cough, dyspnea, wheezing, chest pain, palpitations, orthopnea, edema, abdominal pain, nausea, melena, diarrhea, constipation, flank pain, dysuria, hematuria, urinary  Frequency, nocturia, numbness, tingling, seizures,  Focal weakness, Loss of consciousness,  Tremor, insomnia, depression, anxiety, and suicidal ideation.      Objective:  BP 100/70   Pulse 80   Temp (!) 97.4 F (36.3 C)   Ht 5\' 6"  (1.676 m)   Wt 210 lb 2 oz (95.3 kg)   SpO2 99%   BMI 33.92 kg/m   BP Readings from Last 3 Encounters:  06/11/20 100/70  05/17/18 132/76  05/17/18 132/76    Wt Readings from Last 3 Encounters:  06/11/20 210 lb 2 oz (95.3 kg)  09/06/19 197 lb (89.4 kg)  05/17/18 211 lb 12.8 oz (96.1 kg)    General appearance: alert, cooperative and appears stated age Ears: normal TM's and external ear canals both ears Throat: lips, mucosa, and tongue normal; teeth and gums normal Neck: no adenopathy,  no carotid bruit, supple, symmetrical, trachea midline and thyroid not enlarged, symmetric, no tenderness/mass/nodules Back: symmetric, no curvature. ROM normal. No CVA tenderness. Lungs: clear to auscultation bilaterally Heart: regular rate and rhythm, S1, S2 normal, no murmur, click, rub or gallop Abdomen: soft, non-tender; bowel sounds normal; no masses,  no organomegaly Pulses: 2+ and symmetric Skin: Skin color, texture, turgor normal. No rashes or lesions Lymph nodes: Cervical, supraclavicular, and axillary nodes normal.  No  results found for: HGBA1C  Lab Results  Component Value Date   CREATININE 0.78 05/17/2018   CREATININE 0.80 11/15/2017   CREATININE 0.75 02/17/2017    Lab Results  Component Value Date   WBC 9.5 02/17/2017   HGB 13.6 02/17/2017   HCT 41.2 02/17/2017   PLT 292.0 02/17/2017   GLUCOSE 75 05/17/2018   CHOL 143 05/13/2015   TRIG 98.0 05/13/2015   HDL 52.70 05/13/2015   LDLCALC 71 05/13/2015   ALT 18 05/17/2018   AST 18 05/17/2018   NA 139 05/17/2018   K 4.0 05/17/2018   CL 102 05/17/2018   CREATININE 0.78 05/17/2018   BUN 11 05/17/2018   CO2 24 05/17/2018   TSH 1.51 05/17/2018     Assessment & Plan:   Problem List Items Addressed This Visit      Unprioritized   Elevated blood pressure reading in office without diagnosis of hypertension    Repeat reading was normal today , done by me       Obesity (BMI 30-39.9) - Primary    Recommend deferring pharmacotherapy until she has given the [place] several weeks .  Discussed topomax and Ozempic .  Screening labs ordered        Relevant Orders   Lipid panel   Comprehensive metabolic panel   Hemoglobin A1c   TSH   Lacrimal duct stenosis, bilateral    Surgery was consider a failure ; she continues to have excessive drainage from left eye        Other Visit Diagnoses    Fatigue, unspecified type       Relevant Orders   CBC with Differential/Platelet   Need for Tdap vaccination       Relevant Orders   Tdap vaccine greater than or equal to 7yo IM (Completed)   Colon cancer screening          I am having Christine Monroe maintain her ALPRAZolam, meloxicam, predniSONE, triamcinolone, predniSONE, PARoxetine, spironolactone, and valACYclovir.  No orders of the defined types were placed in this encounter.   There are no discontinued medications.  Follow-up: No follow-ups on file.   Crecencio Mc, MD

## 2020-06-11 NOTE — Assessment & Plan Note (Signed)
Surgery was consider a failure ; she continues to have excessive drainage from left eye

## 2020-06-11 NOTE — Assessment & Plan Note (Addendum)
Repeat reading was normal today , done by me

## 2020-06-11 NOTE — Patient Instructions (Signed)
We will start topiramate in 2-3 weeks if you are not seeing results on Optavia alone  Referral to GI for a colonoscopy is recommended    You received a TDaP vaccine today

## 2020-06-11 NOTE — Assessment & Plan Note (Signed)
Recommend deferring pharmacotherapy until she has given the [place] several weeks .  Discussed topomax and Ozempic .  Screening labs ordered

## 2020-06-12 LAB — CBC WITH DIFFERENTIAL/PLATELET
Basophils Absolute: 0.1 10*3/uL (ref 0.0–0.1)
Basophils Relative: 0.6 % (ref 0.0–3.0)
Eosinophils Absolute: 0.2 10*3/uL (ref 0.0–0.7)
Eosinophils Relative: 1.8 % (ref 0.0–5.0)
HCT: 43.3 % (ref 36.0–46.0)
Hemoglobin: 14.7 g/dL (ref 12.0–15.0)
Lymphocytes Relative: 22.5 % (ref 12.0–46.0)
Lymphs Abs: 2.2 10*3/uL (ref 0.7–4.0)
MCHC: 34 g/dL (ref 30.0–36.0)
MCV: 89.4 fl (ref 78.0–100.0)
Monocytes Absolute: 0.5 10*3/uL (ref 0.1–1.0)
Monocytes Relative: 5.6 % (ref 3.0–12.0)
Neutro Abs: 6.8 10*3/uL (ref 1.4–7.7)
Neutrophils Relative %: 69.5 % (ref 43.0–77.0)
Platelets: 254 10*3/uL (ref 150.0–400.0)
RBC: 4.84 Mil/uL (ref 3.87–5.11)
RDW: 14.5 % (ref 11.5–15.5)
WBC: 9.7 10*3/uL (ref 4.0–10.5)

## 2020-06-12 LAB — LIPID PANEL
Cholesterol: 212 mg/dL — ABNORMAL HIGH (ref 0–200)
HDL: 46.6 mg/dL (ref >39.00)
NonHDL: 165.49
Total CHOL/HDL Ratio: 5
Triglycerides: 265 mg/dL — ABNORMAL HIGH (ref 0.0–149.0)
VLDL: 53 mg/dL — ABNORMAL HIGH (ref 0.0–40.0)

## 2020-06-12 LAB — COMPREHENSIVE METABOLIC PANEL
ALT: 15 U/L (ref 0–35)
AST: 16 U/L (ref 0–37)
Albumin: 4.5 g/dL (ref 3.5–5.2)
Alkaline Phosphatase: 83 U/L (ref 39–117)
BUN: 14 mg/dL (ref 6–23)
CO2: 29 mEq/L (ref 19–32)
Calcium: 9.6 mg/dL (ref 8.4–10.5)
Chloride: 100 mEq/L (ref 96–112)
Creatinine, Ser: 0.78 mg/dL (ref 0.40–1.20)
GFR: 90.81 mL/min (ref >60.00)
Glucose, Bld: 85 mg/dL (ref 70–99)
Potassium: 4 mEq/L (ref 3.5–5.1)
Sodium: 137 mEq/L (ref 135–145)
Total Bilirubin: 0.7 mg/dL (ref 0.2–1.2)
Total Protein: 7.4 g/dL (ref 6.0–8.3)

## 2020-06-12 LAB — HEMOGLOBIN A1C: Hgb A1c MFr Bld: 5.2 % (ref 4.6–6.5)

## 2020-06-12 LAB — LDL CHOLESTEROL, DIRECT: Direct LDL: 135 mg/dL

## 2020-06-13 ENCOUNTER — Other Ambulatory Visit: Payer: Self-pay | Admitting: Internal Medicine

## 2020-06-13 DIAGNOSIS — E669 Obesity, unspecified: Secondary | ICD-10-CM

## 2020-06-13 LAB — TSH: TSH: 1.68 u[IU]/mL (ref 0.35–4.50)

## 2020-06-13 MED ORDER — TOPIRAMATE 25 MG PO TABS: 25.0000 mg | ORAL_TABLET | Freq: Every day | ORAL | 1 refills | Status: DC

## 2020-06-13 NOTE — Assessment & Plan Note (Signed)
Screening labs normal;  No prediabetes.   topamax prescribedfor start after 2 weeks of Optavia if no weight  loss is noted

## 2020-06-26 ENCOUNTER — Telehealth (INDEPENDENT_AMBULATORY_CARE_PROVIDER_SITE_OTHER): Payer: Self-pay | Admitting: Gastroenterology

## 2020-06-26 ENCOUNTER — Other Ambulatory Visit: Payer: Self-pay

## 2020-06-26 DIAGNOSIS — Z1211 Encounter for screening for malignant neoplasm of colon: Secondary | ICD-10-CM

## 2020-06-26 DIAGNOSIS — Z800 Family history of malignant neoplasm of digestive organs: Secondary | ICD-10-CM

## 2020-06-26 MED ORDER — NA SULFATE-K SULFATE-MG SULF 17.5-3.13-1.6 GM/177ML PO SOLN: 1.0000 | Freq: Once | ORAL | 0 refills | Status: AC

## 2020-06-26 NOTE — Progress Notes (Signed)
Gastroenterology Pre-Procedure Review  Request Date: Monday 07/15/20 Requesting Physician: Dr. Vicente Males  PATIENT REVIEW QUESTIONS: The patient responded to the following health history questions as indicated:    1. Are you having any GI issues? no 2. Do you have a personal history of Polyps? no 3. Do you have a family history of Colon Cancer or Polyps? yes (Father colon cancer) 4. Diabetes Mellitus? no 5. Joint replacements in the past 12 months?Knee Surgery January 2022 6. Major health problems in the past 3 months?no 7. Any artificial heart valves, MVP, or defibrillator?no    MEDICATIONS & ALLERGIES:    Patient reports the following regarding taking any anticoagulation/antiplatelet therapy:   Plavix, Coumadin, Eliquis, Xarelto, Lovenox, Pradaxa, Brilinta, or Effient? no Aspirin? no  Patient confirms/reports the following medications:  Current Outpatient Medications  Medication Sig Dispense Refill  . ALPRAZolam (XANAX) 0.25 MG tablet TAKE 1 TABLET (0.25 MG TOTAL) BY MOUTH AT BEDTIME AS NEEDED FOR SLEEP. 30 tablet 0  . PARoxetine (PAXIL) 20 MG tablet TAKE 1 TABLET BY MOUTH EVERY DAY 90 tablet 1  . spironolactone (ALDACTONE) 25 MG tablet Take 1 tablet (25 mg total) by mouth 2 (two) times daily. 60 tablet 2  . topiramate (TOPAMAX) 25 MG tablet Take 1 tablet (25 mg total) by mouth daily. Increase weekly as needed by 25 mg daily,  Max dose 100 mg daily 90 tablet 1  . triamcinolone cream (KENALOG) 0.1 % Apply 1 application topically 2 (two) times daily. 80 g 0  . valACYclovir (VALTREX) 1000 MG tablet Start Valacyclovir 1 gm take 2 tablets at first sign of flare repeat in 12 hours, or take 1 tablet twice a day for 5 days 30 tablet 11  . meloxicam (MOBIC) 15 MG tablet Take 15 mg by mouth daily. (Patient not taking: No sig reported)     No current facility-administered medications for this visit.    Patient confirms/reports the following allergies:  No Known Allergies  No orders of the  defined types were placed in this encounter.   AUTHORIZATION INFORMATION Primary Insurance: 1D#: Group #:  Secondary Insurance: 1D#: Group #:  SCHEDULE INFORMATION: Date: Monday 07/15/20 Time: Location:[facility]

## 2020-07-09 DIAGNOSIS — Z1231 Encounter for screening mammogram for malignant neoplasm of breast: Secondary | ICD-10-CM | POA: Diagnosis not present

## 2020-07-09 LAB — HM MAMMOGRAPHY

## 2020-07-11 ENCOUNTER — Other Ambulatory Visit
Admission: RE | Admit: 2020-07-11 | Discharge: 2020-07-11 | Disposition: A | Discharge status: Home / Self Care | Payer: BLUE CROSS/BLUE SHIELD | Source: Ambulatory Visit | Attending: Gastroenterology | Admitting: Gastroenterology

## 2020-07-11 ENCOUNTER — Telehealth: Payer: Self-pay

## 2020-07-11 ENCOUNTER — Other Ambulatory Visit: Payer: Self-pay | Admitting: Internal Medicine

## 2020-07-11 ENCOUNTER — Other Ambulatory Visit: Payer: Self-pay

## 2020-07-11 DIAGNOSIS — R079 Chest pain, unspecified: Secondary | ICD-10-CM | POA: Diagnosis not present

## 2020-07-11 DIAGNOSIS — Z01812 Encounter for preprocedural laboratory examination: Secondary | ICD-10-CM | POA: Diagnosis not present

## 2020-07-11 DIAGNOSIS — R457 State of emotional shock and stress, unspecified: Secondary | ICD-10-CM | POA: Diagnosis not present

## 2020-07-11 DIAGNOSIS — U071 COVID-19: Secondary | ICD-10-CM | POA: Insufficient documentation

## 2020-07-11 DIAGNOSIS — I10 Essential (primary) hypertension: Secondary | ICD-10-CM | POA: Diagnosis not present

## 2020-07-11 DIAGNOSIS — R0789 Other chest pain: Secondary | ICD-10-CM | POA: Diagnosis not present

## 2020-07-11 LAB — SARS CORONAVIRUS 2 (TAT 6-24 HRS): SARS Coronavirus 2: POSITIVE — AB

## 2020-07-11 NOTE — Telephone Encounter (Signed)
Patient was instructed to call 911 by access nurse.    [facility] [place] RECORD AccessNurse Patient Name: Christine Monroe ER Gender: Female DOB: Sep 03, 1975 Age: 44 Y 11 M 7 D Return Phone Number: [phone] (Primary) Address: City/ State/ Zip: [place]  [address] Client Rule Client Site [facility] Physician Deborra Medina - MD Contact Type Call Who Is Calling Patient / Member / Family / Caregiver Call Type Triage / Clinical Relationship To Patient Self Return Phone Number [phone] (Primary) Chief Complaint CHEST PAIN - pain, pressure, heaviness or tightness Reason for Call Symptomatic / Request for Monon states they have right hand tingling and chest tightness. Translation No Nurse Assessment Nurse: Loletha Carrow, RN, Ronalee Belts Date/Time (Eastern Time): 07/11/2020 2:03:31 PM Confirm and document reason for call. If symptomatic, describe symptoms. ---Caller states they have right hand tingling and R upper chest tightness and headache since today. Does the patient have any new or worsening symptoms? ---Yes Will a triage be completed? ---Yes Related visit to physician within the last 2 weeks? ---No Does the PT have any chronic conditions? (i.e. diabetes, asthma, this includes High risk factors for pregnancy, etc.) ---No Is the patient pregnant or possibly pregnant? (Ask all females between the ages of 30-55) ---No Is this a behavioral health or substance abuse call? ---No Guidelines Guideline Title Affirmed Question Affirmed Notes Nurse Date/Time (Eastern Time) Chest Pain [1] Chest pain lasts > 5 minutes AND [2] age > 60 Emch, RN, Ronalee Belts 07/11/2020 2:05:32 PM Disp. Time Eilene Ghazi Time) Disposition Final User 07/11/2020 2:00:59 PM Send to Urgent Bonita Quin, Melissa PLEASE NOTE: All timestamps contained  within this report are represented as [place] Standard Time. CONFIDENTIALTY NOTICE: This fax transmission is intended only for the addressee. It contains information that is legally privileged, confidential or otherwise protected from use or disclosure. If you are not the intended recipient, you are strictly prohibited from reviewing, disclosing, copying using or disseminating any of this information or taking any action in reliance on or regarding this information. If you have received this fax in error, please notify us immediately by telephone so that we can arrange for its return to [place]. Phone: [phone], Toll-Free: [phone], Fax: [phone] Page: 2 of 2 Call Id: [id number] [place]. Time Eilene Ghazi Time) Disposition Final User 07/11/2020 2:14:36 PM 911 Outcome Documentation Emch, RN, Ronalee Belts Reason: Called Pt back after 5 min EMS on the way 07/11/2020 2:07:49 PM Call EMS 911 Now Yes Emch, RN, Vicenta Dunning Disagree/Comply Comply Caller Understands Yes PreDisposition InappropriateToAsk Care Advice Given Per Guideline CALL EMS 911 NOW: * Immediate medical attention is needed. You need to hang up and call 911 (or an ambulance). CARE ADVICE given per Chest Pain (Adult) guideline.

## 2020-07-11 NOTE — Telephone Encounter (Signed)
Pt called in and states that she has right hand tingling, headache, and chest tightness for the past hour. Her latest bp reading taken at home was 130/102. Transferred to [facility] at Access Nurse to triage.

## 2020-07-12 ENCOUNTER — Telehealth: Payer: Self-pay

## 2020-07-12 NOTE — Telephone Encounter (Signed)
Pt. States she has COVID 19 in January and has no symptoms today.

## 2020-07-12 NOTE — Telephone Encounter (Signed)
Called and informed patient covid test came back positive. Patient informed me she had covid towards the end of January. We ahead and reschedule patients procedure. Notified Endo unit of change.

## 2020-07-12 NOTE — Telephone Encounter (Signed)
[name] called and patient is COVID positive. Please call and inform patient and let her know where she is moving to

## 2020-07-12 NOTE — Telephone Encounter (Signed)
Called to discuss with patient about COVID-19 symptoms and the use of one of the available treatments for those with mild to moderate Covid symptoms and at a high risk of hospitalization.  Pt appears to qualify for outpatient treatment due to co-morbid conditions and/or a member of an at-risk group in accordance with the FDA Emergency Use Authorization.    Symptom onset:Unknown  Vaccinated: Unknown Booster? Unknown Immunocompromised? No Qualifiers: DM,Asthma  Unable to reach pt - Left message and call back number [phone].  Marcello Moores

## 2020-07-12 NOTE — Progress Notes (Signed)
Jovon patient covid positive- reschedule procedure   C/c Crecencio Mc, MD   Dr Jonathon Bellows MD,MRCP [facility]) Gastroenterology/Hepatology Pager: [phone]

## 2020-07-12 NOTE — Telephone Encounter (Signed)
-----   Message from Jonathon Bellows, MD sent at 07/12/2020  8:49 AM EDT ----- Christine Monroe patient covid positive- reschedule procedure   C/c Crecencio Mc, MD   Dr Jonathon Bellows MD,MRCP [facility]) Gastroenterology/Hepatology Pager: [phone]

## 2020-07-14 ENCOUNTER — Encounter: Payer: Self-pay | Admitting: Internal Medicine

## 2020-07-14 DIAGNOSIS — Z8616 Personal history of COVID-19: Secondary | ICD-10-CM | POA: Insufficient documentation

## 2020-07-17 ENCOUNTER — Other Ambulatory Visit: Payer: Self-pay | Admitting: Internal Medicine

## 2020-07-18 NOTE — Telephone Encounter (Signed)
RX Refill:xanax Last Seen:06-11-20 Last ordered:12-06-18

## 2020-07-25 ENCOUNTER — Other Ambulatory Visit: Payer: Self-pay | Admitting: Internal Medicine

## 2020-08-05 ENCOUNTER — Encounter: Payer: Self-pay | Admitting: Gastroenterology

## 2020-08-05 ENCOUNTER — Ambulatory Visit
Admission: RE | Admit: 2020-08-05 | Discharge: 2020-08-05 | Disposition: A | Discharge status: Home / Self Care | Payer: BLUE CROSS/BLUE SHIELD | Attending: Gastroenterology | Admitting: Gastroenterology

## 2020-08-05 ENCOUNTER — Ambulatory Visit: Payer: BLUE CROSS/BLUE SHIELD | Admitting: Registered Nurse

## 2020-08-05 ENCOUNTER — Encounter
Admission: RE | Disposition: A | Discharge status: Home / Self Care | Payer: Self-pay | Source: Home / Self Care | Attending: Gastroenterology

## 2020-08-05 DIAGNOSIS — Z800 Family history of malignant neoplasm of digestive organs: Secondary | ICD-10-CM

## 2020-08-05 DIAGNOSIS — Z8249 Family history of ischemic heart disease and other diseases of the circulatory system: Secondary | ICD-10-CM | POA: Diagnosis not present

## 2020-08-05 DIAGNOSIS — K5790 Diverticulosis of intestine, part unspecified, without perforation or abscess without bleeding: Secondary | ICD-10-CM | POA: Diagnosis not present

## 2020-08-05 DIAGNOSIS — K5730 Diverticulosis of large intestine without perforation or abscess without bleeding: Secondary | ICD-10-CM | POA: Diagnosis not present

## 2020-08-05 DIAGNOSIS — Z9049 Acquired absence of other specified parts of digestive tract: Secondary | ICD-10-CM | POA: Insufficient documentation

## 2020-08-05 DIAGNOSIS — Z1211 Encounter for screening for malignant neoplasm of colon: Secondary | ICD-10-CM | POA: Diagnosis not present

## 2020-08-05 DIAGNOSIS — Z79899 Other long term (current) drug therapy: Secondary | ICD-10-CM | POA: Diagnosis not present

## 2020-08-05 HISTORY — PX: COLONOSCOPY WITH PROPOFOL: SHX5780

## 2020-08-05 SURGERY — COLONOSCOPY WITH PROPOFOL
Anesthesia: General

## 2020-08-05 MED ORDER — SODIUM CHLORIDE 0.9 % IV SOLN: INTRAVENOUS | Status: DC

## 2020-08-05 MED ORDER — PROPOFOL 10 MG/ML IV BOLUS
INTRAVENOUS | Status: DC | PRN
  Administered 2020-08-05: 100 mg via INTRAVENOUS

## 2020-08-05 MED ORDER — PROPOFOL 500 MG/50ML IV EMUL
INTRAVENOUS | Status: DC | PRN
  Administered 2020-08-05: 120 ug/kg/min via INTRAVENOUS

## 2020-08-05 MED ORDER — LIDOCAINE HCL (CARDIAC) PF 100 MG/5ML IV SOSY
PREFILLED_SYRINGE | INTRAVENOUS | Status: DC | PRN
  Administered 2020-08-05: 50 mg via INTRAVENOUS

## 2020-08-05 NOTE — Op Note (Signed)
[facility] Gastroenterology Patient Name: Christine Monroe Procedure Date: 08/05/2020 9:12 AM MRN: [id number] Account #: [id number] Date of Birth: Aug 11, 1975 Admit Type: Outpatient Age: 45 Room: [facility] ENDO ROOM 4 Gender: Female Note Status: Finalized Procedure:             Colonoscopy Indications:           Screening in patient at increased risk: Family history                         of 1st-degree relative with colorectal cancer Providers:             Jonathon Bellows MD, MD Referring MD:          Deborra Medina, MD (Referring MD) Medicines:             Monitored Anesthesia Care Complications:         No immediate complications. Procedure:             Pre-Anesthesia Assessment:                        - Prior to the procedure, a History and Physical was                         performed, and patient medications, allergies and                         sensitivities were reviewed. The patient's tolerance                         of previous anesthesia was reviewed.                        - The risks and benefits of the procedure and the                         sedation options and risks were discussed with the                         patient. All questions were answered and informed                         consent was obtained.                        - ASA Grade Assessment: II - A patient with mild                         systemic disease.                        After obtaining informed consent, the colonoscope was                         passed under direct vision. Throughout the procedure,                         the patient's blood pressure, pulse, and oxygen                         saturations were monitored  continuously. The                         Colonoscope was introduced through the anus and                         advanced to the the cecum, identified by the                         appendiceal orifice. The colonoscopy was performed                         with ease. The  patient tolerated the procedure well.                         The quality of the bowel preparation was excellent. Findings:      The perianal and digital rectal examinations were normal.      The entire examined colon appeared normal on direct and retroflexion       views.      Multiple small-mouthed diverticula were found in the sigmoid colon. Impression:            - The entire examined colon is normal on direct and                         retroflexion views.                        - No specimens collected. Recommendation:        - Discharge patient to home (with escort).                        - Resume previous diet.                        - Continue present medications.                        - Repeat colonoscopy in 5 years for screening purposes. Procedure Code(s):     --- Professional ---                        [phone], Colonoscopy, flexible; diagnostic, including                         collection of specimen(s) by brushing or washing, when                         performed (separate procedure) Diagnosis Code(s):     --- Professional ---                        Z80.0, Family history of malignant neoplasm of                         digestive organs CPT copyright 2019 American Medical Association. All rights reserved. The codes documented in this report are preliminary and upon coder review may  be revised to meet current compliance requirements. Jonathon Bellows, MD Jonathon Bellows MD, MD 08/05/2020 9:43:58 AM This report has been signed electronically. Number of Addenda: 0 Note Initiated On:  08/05/2020 9:12 AM Scope Withdrawal Time: 0 hours 16 minutes 29 seconds  Total Procedure Duration: 0 hours 19 minutes 3 seconds  Estimated Blood Loss:  Estimated blood loss: none.      [facility]

## 2020-08-05 NOTE — H&P (Signed)
Jonathon Bellows, MD [address], [place], [place], [place], [address] [address], [place], [place], [place], [address] Phone: [phone]  Fax: [phone]  Primary Care Physician:  Crecencio Mc, MD   Pre-Procedure History & Physical: HPI:  Christine Monroe is a 45 y.o. female is here for an colonoscopy.   Past Medical History:  Diagnosis Date  . Dysplastic nevus 01/25/2019   left side above the waistline. Moderate atypia, limited margins free.  Marland Kitchen Dysplastic nevus 02/07/2020   left sup breast  . Panic attacks 1999-08-02   since mother death    Past Surgical History:  Procedure Laterality Date  . CHOLECYSTECTOMY  August 02, 2006    Prior to Admission medications   Medication Sig Start Date End Date Taking? Authorizing Provider  PARoxetine (PAXIL) 20 MG tablet TAKE 1 TABLET BY MOUTH EVERY DAY 07/18/20  Yes Crecencio Mc, MD  spironolactone (ALDACTONE) 25 MG tablet Take 1 tablet (25 mg total) by mouth 2 (two) times daily. 05/29/20  Yes Ralene Bathe, MD  ALPRAZolam Duanne Moron) 0.25 MG tablet TAKE 1 TABLET (0.25 MG TOTAL) BY MOUTH AT BEDTIME AS NEEDED FOR SLEEP. 07/18/20   Crecencio Mc, MD  meloxicam (MOBIC) 15 MG tablet Take 15 mg by mouth daily. Patient not taking: No sig reported 08/15/19   [provider]  triamcinolone cream (KENALOG) 0.1 % Apply 1 application topically 2 (two) times daily. 11/23/19   Crecencio Mc, MD  valACYclovir (VALTREX) 1000 MG tablet Start Valacyclovir 1 gm take 2 tablets at first sign of flare repeat in 12 hours, or take 1 tablet twice a day for 5 days 05/29/20   Ralene Bathe, MD    Allergies as of 06/26/2020  . (No Known Allergies)    Family History  Problem Relation Age of Onset  . Cancer Father        colon cancer in his early 43's   . Hypertension Father   . Aneurysm Mother 54       2001    Social History   Socioeconomic History  . Marital status: Married    Spouse name: Not on file  . Number of children: Not on file  .  Years of education: Not on file  . Highest education level: Not on file  Occupational History  . Not on file  Tobacco Use  . Smoking status: Never Smoker  . Smokeless tobacco: Never Used  Vaping Use  . Vaping Use: Never used  Substance and Sexual Activity  . Alcohol use: No  . Drug use: No  . Sexual activity: Yes  Other Topics Concern  . Not on file  Social History Narrative  . Not on file   Social Determinants of Health   Financial Resource Strain: Not on file  Food Insecurity: Not on file  Transportation Needs: Not on file  Physical Activity: Not on file  Stress: Not on file  Social Connections: Not on file  Intimate Partner Violence: Not on file    Review of Systems: See HPI, otherwise negative ROS  Physical Exam: BP (!) 112/50 (BP Location: Right Arm)   Pulse 86   Temp (!) 97.4 F (36.3 C) (Temporal)   Resp 12   Ht 5\' 6"  (1.676 m)   Wt 93 kg   SpO2 98%   BMI 33.09 kg/m  General:   Alert,  pleasant and cooperative in NAD Head:  Normocephalic and atraumatic. Neck:  Supple; no masses or thyromegaly. Lungs:  Clear throughout to auscultation, normal respiratory effort.    Heart:  +S1, +S2, Regular rate and rhythm, No edema. Abdomen:  Soft, nontender and nondistended. Normal bowel sounds, without guarding, and without rebound.   Neurologic:  Alert and  oriented x4;  grossly normal neurologically.  Impression/Plan: Christine Monroe is here for an colonoscopy to be performed for family history of colon cancer.    Risks, benefits, limitations, and alternatives regarding  colonoscopy have been reviewed with the patient.  Questions have been answered.  All parties agreeable.   Jonathon Bellows, MD  08/05/2020, 9:51 AM

## 2020-08-05 NOTE — Anesthesia Postprocedure Evaluation (Signed)
Anesthesia Post Note  Patient: Christine Monroe  Procedure(s) Performed: COLONOSCOPY WITH PROPOFOL (N/A )  Patient location during evaluation: Endoscopy Anesthesia Type: General Level of consciousness: awake and alert and oriented Pain management: pain level controlled Vital Signs Assessment: post-procedure vital signs reviewed and stable Respiratory status: spontaneous breathing Cardiovascular status: blood pressure returned to baseline Anesthetic complications: no   No complications documented.   Last Vitals:  Vitals:   08/05/20 0946 08/05/20 0947  BP: (!) 112/50 (!) 112/50  Pulse:    Resp:  12  Temp: (!) 36.3 C   SpO2:  98%    Last Pain:  Vitals:   08/05/20 1016  TempSrc:   PainSc: 0-No pain                 Armstead Heiland

## 2020-08-05 NOTE — Anesthesia Preprocedure Evaluation (Signed)
Anesthesia Evaluation  Patient identified by MRN, date of birth, ID band Patient awake    Reviewed: Allergy & Precautions, NPO status , Patient's Chart, lab work & pertinent test results  Airway Mallampati: II  TM Distance: >3 FB     Dental  (+) Teeth Intact   Pulmonary neg pulmonary ROS,    Pulmonary exam normal        Cardiovascular hypertension, Normal cardiovascular exam     Neuro/Psych PSYCHIATRIC DISORDERS Anxiety negative neurological ROS     GI/Hepatic Neg liver ROS, GERD  ,  Endo/Other  negative endocrine ROS  Renal/GU negative Renal ROS  negative genitourinary   Musculoskeletal  (+) Arthritis , Osteoarthritis,    Abdominal Normal abdominal exam  (+)   Peds negative pediatric ROS (+)  Hematology negative hematology ROS (+)   Anesthesia Other Findings Past Medical History: 01/25/2019: Dysplastic nevus     Comment:  left side above the waistline. Moderate atypia, limited               margins free. 02/07/2020: Dysplastic nevus     Comment:  left sup breast 2001: Panic attacks     Comment:  since mother death  Reproductive/Obstetrics                             Anesthesia Physical Anesthesia Plan  ASA: II  Anesthesia Plan: General   Post-op Pain Management:    Induction: Intravenous  PONV Risk Score and Plan: Propofol infusion  Airway Management Planned: Nasal Cannula  Additional Equipment:   Intra-op Plan:   Post-operative Plan:   Informed Consent: I have reviewed the patients History and Physical, chart, labs and discussed the procedure including the risks, benefits and alternatives for the proposed anesthesia with the patient or authorized representative who has indicated his/her understanding and acceptance.     Dental advisory given  Plan Discussed with: CRNA and Surgeon  Anesthesia Plan Comments:         Anesthesia Quick Evaluation

## 2020-08-05 NOTE — Transfer of Care (Signed)
Immediate Anesthesia Transfer of Care Note  Patient: Christine Monroe  Procedure(s) Performed: COLONOSCOPY WITH PROPOFOL (N/A )  Patient Location: PACU and Endoscopy Unit  Anesthesia Type:General  Level of Consciousness: drowsy and patient cooperative  Airway & Oxygen Therapy: Patient Spontanous Breathing  Post-op Assessment: Report given to RN and Post -op Vital signs reviewed and stable  Post vital signs: Reviewed and stable  Last Vitals:  Vitals Value Taken Time  BP 112/50 08/05/20 0947  Temp 36.3 C 08/05/20 0946  Pulse 75 08/05/20 0948  Resp 13 08/05/20 0948  SpO2 98 % 08/05/20 0948  Vitals shown include unvalidated device data.  Last Pain:  Vitals:   08/05/20 0946  TempSrc: Temporal  PainSc: 0-No pain         Complications: No complications documented.

## 2020-08-07 ENCOUNTER — Encounter: Payer: Self-pay | Admitting: Gastroenterology

## 2020-09-03 ENCOUNTER — Ambulatory Visit: Payer: BLUE CROSS/BLUE SHIELD | Admitting: Dermatology

## 2020-09-03 ENCOUNTER — Other Ambulatory Visit: Payer: Self-pay

## 2020-09-03 DIAGNOSIS — L700 Acne vulgaris: Secondary | ICD-10-CM

## 2020-09-03 MED ORDER — WINLEVI 1 % EX CREA: 1.0000 "application " | TOPICAL_CREAM | Freq: Two times a day (BID) | CUTANEOUS | 5 refills | Status: DC

## 2020-09-03 NOTE — Patient Instructions (Signed)

## 2020-09-03 NOTE — Progress Notes (Signed)
   Follow-Up Visit   Subjective  Christine Monroe is a 45 y.o. female who presents for the following: Acne (3 month follow up Increased Spironolactone from 25 mg qd to 50 mg qd and is staying better controlled.).  The following portions of the chart were reviewed this encounter and updated as appropriate:   Tobacco  Allergies  Meds  Problems  Med Hx  Surg Hx  Fam Hx     Review of Systems:  No other skin or systemic complaints except as noted in HPI or Assessment and Plan.  Objective  Well appearing patient in no apparent distress; mood and affect are within normal limits.  A focused examination was performed including face. Relevant physical exam findings are noted in the Assessment and Plan.  Objective  Head - Anterior (Face): Clear today   Assessment & Plan  Acne vulgaris Head - Anterior (Face) Well controlled Tolerating medicines well. Chronic and persistent condition Continue Spironolactone 25 mg 2 po qd.  Start Winlevi cream bid. If acne is still controlled after 3 months, decrease Spironolactone to 25 mg 1 po qd  Blood pressure today - 140/91. Patient labs 2 months ago showed normal kidney function and normal potassium.  Clascoterone (WINLEVI) 1 % CREA - Head - Anterior (Face)  Other Related Medications spironolactone (ALDACTONE) 25 MG tablet  Return in about 6 months (around 03/06/2021) for Acne.  I, Ashok Cordia, CMA, am acting as scribe for Sarina Ser, MD .  Documentation: I have reviewed the above documentation for accuracy and completeness, and I agree with the above.  Sarina Ser, MD

## 2020-09-07 ENCOUNTER — Encounter: Payer: Self-pay | Admitting: Dermatology

## 2020-11-22 ENCOUNTER — Other Ambulatory Visit: Payer: Self-pay | Admitting: Internal Medicine

## 2020-12-27 DIAGNOSIS — M25561 Pain in right knee: Secondary | ICD-10-CM | POA: Diagnosis not present

## 2021-01-06 DIAGNOSIS — N939 Abnormal uterine and vaginal bleeding, unspecified: Secondary | ICD-10-CM | POA: Diagnosis not present

## 2021-02-12 ENCOUNTER — Other Ambulatory Visit: Payer: Self-pay

## 2021-02-12 ENCOUNTER — Encounter: Payer: Self-pay | Admitting: Dermatology

## 2021-02-12 ENCOUNTER — Ambulatory Visit (INDEPENDENT_AMBULATORY_CARE_PROVIDER_SITE_OTHER): Payer: BLUE CROSS/BLUE SHIELD | Admitting: Dermatology

## 2021-02-12 DIAGNOSIS — L700 Acne vulgaris: Secondary | ICD-10-CM

## 2021-02-12 DIAGNOSIS — L859 Epidermal thickening, unspecified: Secondary | ICD-10-CM | POA: Diagnosis not present

## 2021-02-12 DIAGNOSIS — Z1283 Encounter for screening for malignant neoplasm of skin: Secondary | ICD-10-CM

## 2021-02-12 DIAGNOSIS — L814 Other melanin hyperpigmentation: Secondary | ICD-10-CM

## 2021-02-12 DIAGNOSIS — Z86018 Personal history of other benign neoplasm: Secondary | ICD-10-CM | POA: Diagnosis not present

## 2021-02-12 DIAGNOSIS — D1800 Hemangioma unspecified site: Secondary | ICD-10-CM

## 2021-02-12 DIAGNOSIS — D229 Melanocytic nevi, unspecified: Secondary | ICD-10-CM

## 2021-02-12 DIAGNOSIS — L821 Other seborrheic keratosis: Secondary | ICD-10-CM

## 2021-02-12 DIAGNOSIS — L578 Other skin changes due to chronic exposure to nonionizing radiation: Secondary | ICD-10-CM

## 2021-02-12 NOTE — Progress Notes (Signed)
   Follow-Up Visit   Subjective  Christine Monroe is a 45 y.o. female who presents for the following: Acne (Face, no treatment at this time) and Total body skin exam (Hx of Dysplastic nevi, L sup breast, left side above the waistline). The patient presents for Total-Body Skin Exam (TBSE) for skin cancer screening and mole check.  The following portions of the chart were reviewed this encounter and updated as appropriate:   Tobacco  Allergies  Meds  Problems  Med Hx  Surg Hx  Fam Hx     Review of Systems:  No other skin or systemic complaints except as noted in HPI or Assessment and Plan.  Objective  Well appearing patient in no apparent distress; mood and affect are within normal limits.  A full examination was performed including scalp, head, eyes, ears, nose, lips, neck, chest, axillae, abdomen, back, buttocks, bilateral upper extremities, bilateral lower extremities, hands, feet, fingers, toes, fingernails, and toenails. All findings within normal limits unless otherwise noted below.  L sup breast, L sided above the waistline Scars with no evidence of recurrence.   Head - Anterior (Face) 1 pap face  bil feet Hyperkeratosis bil feet   Assessment & Plan   Lentigines - Scattered tan macules - Due to sun exposure - Benign-appearing, observe - Recommend daily broad spectrum sunscreen SPF 30+ to sun-exposed areas, reapply every 2 hours as needed. - Call for any changes  Seborrheic Keratoses - Stuck-on, waxy, tan-brown papules and/or plaques  - Benign-appearing - Discussed benign etiology and prognosis. - Observe - Call for any changes  Melanocytic Nevi - Tan-brown and/or pink-flesh-colored symmetric macules and papules - Benign appearing on exam today - Observation - Call clinic for new or changing moles - Recommend daily use of broad spectrum spf 30+ sunscreen to sun-exposed areas.   Hemangiomas - Red papules - Discussed benign nature - Observe - Call for any  changes  Actinic Damage - Chronic condition, secondary to cumulative UV/sun exposure - diffuse scaly erythematous macules with underlying dyspigmentation - Recommend daily broad spectrum sunscreen SPF 30+ to sun-exposed areas, reapply every 2 hours as needed.  - Staying in the shade or wearing long sleeves, sun glasses (UVA+UVB protection) and wide brim hats (4-inch brim around the entire circumference of the hat) are also recommended for sun protection.  - Call for new or changing lesions.  Skin cancer screening performed today.  History of dysplastic nevus L sup breast, L sided above the waistline  Clear. Observe for recurrence. Call clinic for new or changing lesions.  Recommend regular skin exams, daily broad-spectrum spf 30+ sunscreen use, and photoprotection.    Acne vulgaris Head - Anterior (Face) Chronic and persistent Pt declines treatment at this time Discussed restarting Winlevi cr qhs  Related Medications spironolactone (ALDACTONE) 25 MG tablet Take 1 tablet (25 mg total) by mouth 2 (two) times daily.  Clascoterone (WINLEVI) 1 % CREA Apply 1 application topically 2 (two) times daily.  Hyperkeratosis bil feet Recommend starting Amlactin Rapid Relief cream qd Recommend starting Urea cream 2x/wk Recommend Baby feet  Skin cancer screening  Return in about 1 year (around 02/12/2022) for TBSE, Hx of Dysplastic nevi.  I, Othelia Pulling, RMA, am acting as scribe for Sarina Ser, MD  Documentation: I have reviewed the above documentation for accuracy and completeness, and I agree with the above.  Sarina Ser, MD

## 2021-02-12 NOTE — Patient Instructions (Addendum)
If you have any questions or concerns for your doctor, please call our main line at [phone] and press option 4 to reach your doctor's medical assistant. If no one answers, please leave a voicemail as directed and we will return your call as soon as possible. Messages left after 4 pm will be answered the following business day.   You may also send [place] a message via [place]. We typically respond to MyChart messages within 1-2 business days.  For prescription refills, please ask your pharmacy to contact our office. Our fax number is [phone].  If you have an urgent issue when the clinic is closed that cannot wait until the next business day, you can page your doctor at the number below.    Please note that while we do our best to be available for urgent issues outside of office hours, we are not available 24/7.   If you have an urgent issue and are unable to reach [place], you may choose to seek medical care at your doctor's office, retail clinic, urgent care center, or emergency room.  If you have a medical emergency, please immediately call 911 or go to the emergency department.  Pager Numbers  - Dr. Nehemiah Massed: [phone]  - Dr. Laurence Ferrari: [phone]  - Dr. Nicole Kindred: [phone]  In the event of inclement weather, please call our main line at [phone] for an update on the status of any delays or closures.  Dermatology Medication Tips: Please keep the boxes that topical medications come in in order to help keep track of the instructions about where and how to use these. Pharmacies typically print the medication instructions only on the boxes and not directly on the medication tubes.   If your medication is too expensive, please contact our office at [phone] option 4 or send [place] a message through [place].   We are unable to tell what your co-pay for medications will be in advance as this is different depending on your insurance coverage. However, we may be able to find a substitute  medication at lower cost or fill out paperwork to get insurance to cover a needed medication.   If a prior authorization is required to get your medication covered by your insurance company, please allow [place] 1-2 business days to complete this process.  Drug prices often vary depending on where the prescription is filled and some pharmacies may offer cheaper prices.  The website www.goodrx.com contains coupons for medications through different pharmacies. The prices here do not account for what the cost may be with help from insurance (it may be cheaper with your insurance), but the website can give you the price if you did not use any insurance.  - You can print the associated coupon and take it with your prescription to the pharmacy.  - You may also stop by our office during regular business hours and pick up a GoodRx coupon card.  - If you need your prescription sent electronically to a different pharmacy, notify our office through [facility] or by phone at [phone] option 4.   For scaling of feet Recommend starting Amlactin Rapid relief cream daily Recommend starting Urea cream 2 times a week to feet Recommend Baby feet peel

## 2021-02-25 DIAGNOSIS — M25561 Pain in right knee: Secondary | ICD-10-CM | POA: Diagnosis not present

## 2021-02-25 DIAGNOSIS — M25562 Pain in left knee: Secondary | ICD-10-CM | POA: Diagnosis not present

## 2021-03-02 ENCOUNTER — Other Ambulatory Visit: Payer: Self-pay | Admitting: Internal Medicine

## 2021-03-02 DIAGNOSIS — R748 Abnormal levels of other serum enzymes: Secondary | ICD-10-CM

## 2021-03-02 DIAGNOSIS — E669 Obesity, unspecified: Secondary | ICD-10-CM

## 2021-03-25 ENCOUNTER — Ambulatory Visit: Payer: BLUE CROSS/BLUE SHIELD | Admitting: Internal Medicine

## 2021-04-08 DIAGNOSIS — M170 Bilateral primary osteoarthritis of knee: Secondary | ICD-10-CM | POA: Diagnosis not present

## 2021-04-17 ENCOUNTER — Ambulatory Visit: Payer: BLUE CROSS/BLUE SHIELD | Admitting: Internal Medicine

## 2021-04-17 ENCOUNTER — Encounter: Payer: Self-pay | Admitting: Internal Medicine

## 2021-04-17 ENCOUNTER — Other Ambulatory Visit: Payer: Self-pay

## 2021-04-17 ENCOUNTER — Telehealth: Payer: Self-pay | Admitting: Pharmacist

## 2021-04-17 DIAGNOSIS — E669 Obesity, unspecified: Secondary | ICD-10-CM

## 2021-04-17 DIAGNOSIS — R030 Elevated blood-pressure reading, without diagnosis of hypertension: Secondary | ICD-10-CM | POA: Diagnosis not present

## 2021-04-17 MED ORDER — WEGOVY 0.25 MG/0.5ML ~~LOC~~ SOAJ: 0.2500 mg | SUBCUTANEOUS | 2 refills | Status: DC

## 2021-04-17 NOTE — Patient Instructions (Signed)
I have sent a prescription to [facility] for [facility]  the starting dose of 0.25 mg   and it is taken as a weekly subcutaneous injection. It is not insulin.  It  causes your pancreas to increase its  own insulin secretion  And also slows down the emptying of your stomach,  So it decreases your appetite and helps you lose weight.  The dose for the first 4 weekly doses is 0.25 mg.  You may have mild nausea on the first or second day but this should resolve.  If not  ,  stop the medication.   As long as you are losing weight,  you can continue the dose you are on and refill  until  your weight plateaus .   Let me know when you need a refill , what dose you are taking , and whether your weight has changed

## 2021-04-17 NOTE — Assessment & Plan Note (Addendum)
PREVIOUS TRIALS OF SAXENDA WERE EFFECTIVE AT HELPING patient lose 18 lbs  But plateaued.  More recently topomax tried but not tolerated.  rx for [facility] sent to pharmacy after screening for contraindications and significant  counselling given.

## 2021-04-17 NOTE — Telephone Encounter (Signed)
PA required for [facility]. Completed Key: [id number].

## 2021-04-17 NOTE — Progress Notes (Signed)
Subjective:  Patient ID: Christine Monroe, female    DOB: 1976-02-25  Age: 46 y.o. MRN: [id number]  CC: Diagnoses of Obesity (BMI 30-39.9) and Elevated blood pressure reading in office without diagnosis of hypertension were pertinent to this visit.  HPI Christine Monroe presents for  Chief Complaint  Patient presents with   Discuss weight loss medication   This visit occurred during the SARS-CoV-2 public health emergency.  Safety protocols were in place, including screening questions prior to the visit, additional usage of staff PPE, and extensive cleaning of exam room while observing appropriate contact time as indicated for disinfecting solutions.   Christine Monroe is a 46 yr old female with a history of obesity and anxiety who presents for follow up on both.  She was last seen in March, at which time she has planned to start the [place] for weight loss He has not working out lately due to chronic knee pain bilaterally due to bilateral chondromalacia of the patella .  Saw orthopedics,  last cortisone injection Nov left knee,  in . August right one.    Advised to ride bike.    She reports that she did lose weight on the [place] but plans to restart the diet and has ordered more food from the company.    Reviewed the principles of weight loss including the need for dietary measures that are sustainable, some form of aerobic exercise DAILY, as well as the risks and benefits of GLP-1 agonists, namely [id number]   Outpatient Medications Prior to Visit  Medication Sig Dispense Refill   ALPRAZolam (XANAX) 0.25 MG tablet TAKE 1 TABLET (0.25 MG TOTAL) BY MOUTH AT BEDTIME AS NEEDED FOR SLEEP. 30 tablet 5   meloxicam (MOBIC) 15 MG tablet Take 15 mg by mouth daily.     PARoxetine (PAXIL) 20 MG tablet TAKE 1 TABLET BY MOUTH EVERY DAY 90 tablet 1   valACYclovir (VALTREX) 1000 MG tablet Start Valacyclovir 1 gm take 2 tablets at first sign of flare repeat in 12 hours, or take 1 tablet twice a day for 5 days 30  tablet 11   Clascoterone (WINLEVI) 1 % CREA Apply 1 application topically 2 (two) times daily. (Patient not taking: Reported on 04/17/2021) 60 g 5   spironolactone (ALDACTONE) 25 MG tablet Take 1 tablet (25 mg total) by mouth 2 (two) times daily. (Patient not taking: Reported on 02/12/2021) 60 tablet 2   traMADol (ULTRAM) 50 MG tablet Take 50 mg by mouth every 6 (six) hours as needed. (Patient not taking: Reported on 04/17/2021)     triamcinolone cream (KENALOG) 0.1 % Apply 1 application topically 2 (two) times daily. (Patient not taking: Reported on 04/17/2021) 80 g 0   No facility-administered medications prior to visit.    Review of Systems;  Patient denies headache, fevers, malaise, unintentional weight loss, skin rash, eye pain, sinus congestion and sinus pain, sore throat, dysphagia,  hemoptysis , cough, dyspnea, wheezing, chest pain, palpitations, orthopnea, edema, abdominal pain, nausea, melena, diarrhea, constipation, flank pain, dysuria, hematuria, urinary  Frequency, nocturia, numbness, tingling, seizures,  Focal weakness, Loss of consciousness,  Tremor, insomnia, depression, anxiety, and suicidal ideation.      Objective:  BP 122/88 (BP Location: Left Arm, Patient Position: Sitting, Cuff Size: Large)    Pulse 84    Temp 98 F (36.7 C) (Oral)    Ht 5\' 6"  (1.676 m)    Wt 215 lb 12.8 oz (97.9 kg)    SpO2 96%  BMI 34.83 kg/m   BP Readings from Last 3 Encounters:  04/17/21 122/88  08/05/20 (!) 112/50  06/11/20 100/70    Wt Readings from Last 3 Encounters:  04/17/21 215 lb 12.8 oz (97.9 kg)  08/05/20 205 lb (93 kg)  06/11/20 210 lb 2 oz (95.3 kg)    General appearance: alert, cooperative and appears stated age Ears: normal TM's and external ear canals both ears Throat: lips, mucosa, and tongue normal; teeth and gums normal Neck: no adenopathy, no carotid bruit, supple, symmetrical, trachea midline and thyroid not enlarged, symmetric, no tenderness/mass/nodules Back: symmetric, no  curvature. ROM normal. No CVA tenderness. Lungs: clear to auscultation bilaterally Heart: regular rate and rhythm, S1, S2 normal, no murmur, click, rub or gallop Abdomen: soft, non-tender; bowel sounds normal; no masses,  no organomegaly Pulses: 2+ and symmetric Skin: Skin color, texture, turgor normal. No rashes or lesions Lymph nodes: Cervical, supraclavicular, and axillary nodes normal.  Lab Results  Component Value Date   HGBA1C 5.2 06/11/2020    Lab Results  Component Value Date   CREATININE 0.78 06/11/2020   CREATININE 0.78 05/17/2018   CREATININE 0.80 11/15/2017    Lab Results  Component Value Date   WBC 9.7 06/11/2020   HGB 14.7 06/11/2020   HCT 43.3 06/11/2020   PLT 254.0 06/11/2020   GLUCOSE 85 06/11/2020   CHOL 212 (H) 06/11/2020   TRIG 265.0 (H) 06/11/2020   HDL 46.60 06/11/2020   LDLDIRECT 135.0 06/11/2020   LDLCALC 71 05/13/2015   ALT 15 06/11/2020   AST 16 06/11/2020   NA 137 06/11/2020   K 4.0 06/11/2020   CL 100 06/11/2020   CREATININE 0.78 06/11/2020   BUN 14 06/11/2020   CO2 29 06/11/2020   TSH 1.68 06/11/2020   HGBA1C 5.2 06/11/2020    No results found.  Assessment & Plan:   Problem List Items Addressed This Visit     Elevated blood pressure reading in office without diagnosis of hypertension    Diastolic elevation noted today.  She has no history of hypertension and has been asked to check her pressures away from office and submit readings for evaluation. Renal function is normal  Lab Results  Component Value Date   CREATININE 0.78 06/11/2020   No results found for: LABMICR, MICROALBUR          Relevant Orders   Microalbumin / creatinine urine ratio   Obesity (BMI 30-39.9)    PREVIOUS TRIALS OF SAXENDA WERE EFFECTIVE AT HELPING patient lose 18 lbs  But plateaued.  More recently topomax tried but not tolerated.  rx for [facility] sent to pharmacy after screening for contraindications and significant  counselling given.       Relevant Medications   Semaglutide-Weight Management (WEGOVY) 0.25 MG/0.5ML SOAJ    I have discontinued Christine Monroe's triamcinolone cream, spironolactone, Winlevi, and traMADol. I am also having her start on Wegovy. Additionally, I am having her maintain her meloxicam, valACYclovir, ALPRAZolam, and PARoxetine.  Meds ordered this encounter  Medications   Semaglutide-Weight Management (WEGOVY) 0.25 MG/0.5ML SOAJ    Sig: Inject 0.25 mg into the skin once a week.    Dispense:  2 mL    Refill:  2     I provided  30 minutes of  face-to-face time during this encounter reviewing patient's current problems and past surgeries, labs and imaging studies, providing counseling on the above mentioned problems , and coordination  of care .   Follow-up: Return in about 6 months (  around 10/15/2021).   Crecencio Mc, MD

## 2021-04-19 NOTE — Assessment & Plan Note (Signed)
Diastolic elevation noted today.  She has no history of hypertension and has been asked to check her pressures away from office and submit readings for evaluation. Renal function is normal  Lab Results  Component Value Date   CREATININE 0.78 06/11/2020   No results found for: LABMICR, MICROALBUR

## 2021-04-21 NOTE — Telephone Encounter (Signed)
PA approved for [facility] 04/17/21-08/20/21. It notes " Please note: This medication is to be titrated up in strength every 4 weeks as tolerated by the member. The quantity limit set of 4 pens per 180 days allows for this titration through all strengths using 4 pens of each strength every 28 days"

## 2021-04-22 MED ORDER — WEGOVY 0.25 MG/0.5ML ~~LOC~~ SOAJ: 0.2500 mg | SUBCUTANEOUS | 2 refills | Status: DC

## 2021-04-22 NOTE — Addendum Note (Signed)
Addended by: De Hollingshead on: 04/22/2021 02:35 PM   Modules accepted: Orders

## 2021-05-21 ENCOUNTER — Encounter: Payer: Self-pay | Admitting: Internal Medicine

## 2021-05-22 MED ORDER — SEMAGLUTIDE-WEIGHT MANAGEMENT 0.5 MG/0.5ML ~~LOC~~ SOAJ: 0.5000 mg | SUBCUTANEOUS | 1 refills | Status: DC

## 2021-06-15 ENCOUNTER — Encounter: Payer: Self-pay | Admitting: Internal Medicine

## 2021-06-15 ENCOUNTER — Other Ambulatory Visit: Payer: Self-pay | Admitting: Internal Medicine

## 2021-06-16 MED ORDER — SEMAGLUTIDE-WEIGHT MANAGEMENT 1 MG/0.5ML ~~LOC~~ SOAJ: 1.0000 mg | SUBCUTANEOUS | 2 refills | Status: DC

## 2021-07-13 ENCOUNTER — Encounter: Payer: Self-pay | Admitting: Internal Medicine

## 2021-07-14 DIAGNOSIS — M170 Bilateral primary osteoarthritis of knee: Secondary | ICD-10-CM | POA: Diagnosis not present

## 2021-07-16 MED ORDER — SEMAGLUTIDE-WEIGHT MANAGEMENT 1.7 MG/0.75ML ~~LOC~~ SOAJ: 1.7000 mg | SUBCUTANEOUS | 2 refills | Status: DC

## 2021-07-29 DIAGNOSIS — M1712 Unilateral primary osteoarthritis, left knee: Secondary | ICD-10-CM | POA: Diagnosis not present

## 2021-08-05 DIAGNOSIS — M1712 Unilateral primary osteoarthritis, left knee: Secondary | ICD-10-CM | POA: Diagnosis not present

## 2021-08-10 ENCOUNTER — Encounter: Payer: Self-pay | Admitting: Internal Medicine

## 2021-08-12 ENCOUNTER — Encounter: Payer: Self-pay | Admitting: Internal Medicine

## 2021-08-12 ENCOUNTER — Ambulatory Visit: Payer: BLUE CROSS/BLUE SHIELD | Admitting: Internal Medicine

## 2021-08-12 DIAGNOSIS — L247 Irritant contact dermatitis due to plants, except food: Secondary | ICD-10-CM

## 2021-08-12 DIAGNOSIS — M1712 Unilateral primary osteoarthritis, left knee: Secondary | ICD-10-CM | POA: Diagnosis not present

## 2021-08-12 MED ORDER — PREDNISONE 10 MG PO TABS: ORAL_TABLET | ORAL | 0 refills | Status: DC

## 2021-08-12 MED ORDER — TRIAMCINOLONE ACETONIDE 0.1 % EX CREA: 1.0000 "application " | TOPICAL_CREAM | Freq: Two times a day (BID) | CUTANEOUS | 0 refills | Status: DC

## 2021-08-12 MED ORDER — HYDROXYZINE PAMOATE 25 MG PO CAPS: 25.0000 mg | ORAL_CAPSULE | Freq: Three times a day (TID) | ORAL | 0 refills | Status: DC | PRN

## 2021-08-12 NOTE — Progress Notes (Signed)
? ?Subjective:  ?Patient ID: Christine Monroe, female    DOB: 07/04/1975  Age: 46 y.o. MRN: [id number] ? ?CC: The encounter diagnosis was Contact dermatitis and eczema due to plant. ? ? ?This visit occurred during the SARS-CoV-2 public health emergency.  Safety protocols were in place, including screening questions prior to the visit, additional usage of staff PPE, and extensive cleaning of exam room while observing appropriate contact time as indicated for disinfecting solutions.   ? ?HPI ?Shearon Stalls presents for  ?Chief Complaint  ?Patient presents with  ? Poison Ivy  ? ?Rash started on Saturday after  comoing in conatct with her dog wh had been running through a patch on their property .  Erythematous itchy patches on both legs  and forearms .  No fevers,  no blistering or pustules.   ? ? ?Outpatient Medications Prior to Visit  ?Medication Sig Dispense Refill  ? ALPRAZolam (XANAX) 0.25 MG tablet TAKE 1 TABLET (0.25 MG TOTAL) BY MOUTH AT BEDTIME AS NEEDED FOR SLEEP. 30 tablet 5  ? meloxicam (MOBIC) 15 MG tablet Take 15 mg by mouth daily.    ? PARoxetine (PAXIL) 20 MG tablet TAKE 1 TABLET BY MOUTH EVERY DAY 90 tablet 1  ? [START ON 08/13/2021] Semaglutide-Weight Management 1 MG/0.5ML SOAJ Inject 1 mg into the skin once a week for 28 days. 2 mL 2  ? [START ON 10/11/2021] Semaglutide-Weight Management 1.7 MG/0.75ML SOAJ Inject 1.7 mg into the skin once a week for 28 days. 3 mL 2  ? valACYclovir (VALTREX) 1000 MG tablet Start Valacyclovir 1 gm take 2 tablets at first sign of flare repeat in 12 hours, or take 1 tablet twice a day for 5 days 30 tablet 11  ? traMADol (ULTRAM) 50 MG tablet Take 50 mg by mouth every 6 (six) hours as needed. (Patient not taking: Reported on 08/12/2021)    ? ?No facility-administered medications prior to visit.  ? ? ?Review of Systems; ? ?Patient denies headache, fevers, malaise, unintentional weight loss, skin rash, eye pain, sinus congestion and sinus pain, sore throat, dysphagia,  hemoptysis ,  cough, dyspnea, wheezing, chest pain, palpitations, orthopnea, edema, abdominal pain, nausea, melena, diarrhea, constipation, flank pain, dysuria, hematuria, urinary  Frequency, nocturia, numbness, tingling, seizures,  Focal weakness, Loss of consciousness,  Tremor, insomnia, depression, anxiety, and suicidal ideation.   ? ? ? ?Objective:  ?BP (!) 130/92 (BP Location: Left Arm, Patient Position: Sitting, Cuff Size: Large)   Pulse 68   Temp 97.7 ?F (36.5 ?C) (Oral)   Ht '5\' 6"'$  (1.676 m)   Wt 193 lb (87.5 kg)   SpO2 95%   BMI 31.15 kg/m?  ? ?BP Readings from Last 3 Encounters:  ?08/12/21 (!) 130/92  ?04/17/21 122/88  ?08/05/20 (!) 112/50  ? ? ?Wt Readings from Last 3 Encounters:  ?08/12/21 193 lb (87.5 kg)  ?04/17/21 215 lb 12.8 oz (97.9 kg)  ?08/05/20 205 lb (93 kg)  ? ? ?General appearance: alert, cooperative and appears stated age ?Ears: normal TM's and external ear canals both ears ?Throat: lips, mucosa, and tongue normal; teeth and gums normal ?Neck: no adenopathy, no carotid bruit, supple, symmetrical, trachea midline and thyroid not enlarged, symmetric, no tenderness/mass/nodules ?Back: symmetric, no curvature. ROM normal. No CVA tenderness. ?Lungs: clear to auscultation bilaterally ?Heart: regular rate and rhythm, S1, S2 normal, no murmur, click, rub or gallop ?Abdomen: soft, non-tender; bowel sounds normal; no masses,  no organomegaly ?Pulses: 2+ and symmetric ?Skin:  multiple erythematous patches  covering both forearms,  both popliteal fossa,  and thighs  ?Lymph nodes: Cervical, supraclavicular, and axillary nodes normal. ? ?Lab Results  ?Component Value Date  ? HGBA1C 5.2 06/11/2020  ? ? ?Lab Results  ?Component Value Date  ? CREATININE 0.78 06/11/2020  ? CREATININE 0.78 05/17/2018  ? CREATININE 0.80 11/15/2017  ? ? ?Lab Results  ?Component Value Date  ? WBC 9.7 06/11/2020  ? HGB 14.7 06/11/2020  ? HCT 43.3 06/11/2020  ? PLT 254.0 06/11/2020  ? GLUCOSE 85 06/11/2020  ? CHOL 212 (H) 06/11/2020  ? TRIG  265.0 (H) 06/11/2020  ? HDL 46.60 06/11/2020  ? LDLDIRECT 135.0 06/11/2020  ? [place] 71 05/13/2015  ? ALT 15 06/11/2020  ? AST 16 06/11/2020  ? NA 137 06/11/2020  ? K 4.0 06/11/2020  ? CL 100 06/11/2020  ? CREATININE 0.78 06/11/2020  ? BUN 14 06/11/2020  ? CO2 29 06/11/2020  ? TSH 1.68 06/11/2020  ? HGBA1C 5.2 06/11/2020  ? ? ?No results found. ? ?Assessment & Plan:  ? ?Problem List Items Addressed This Visit   ? ? Contact dermatitis and eczema due to plant  ?  secondary to poison ivy.  She has patches on both forearms , popliteal fossae,  And face.  Prednisone prolonged taper, triamcinolone cream  And hydroxyzine  ? ?  ?  ? ? ?Follow-up: No follow-ups on file. ? ? ?Crecencio Mc, MD ?

## 2021-08-12 NOTE — Patient Instructions (Signed)
Contact dermatitis due to poison ivy contact: ? ?Start the prednisone today: ? ?6 tablets all at once on Days 1, 2 and 3.  On Day 4 start reducing  1 tablet daily until gone (6-5-4-3-2-1) ? ?Use the ointment twice daily on all itchy spots .  If able, wrap the area In saran wrap overnight  ?  ?

## 2021-08-12 NOTE — Assessment & Plan Note (Signed)
secondary to poison ivy.  She has patches on both forearms , popliteal fossae,  And face.  Prednisone prolonged taper, triamcinolone cream  And hydroxyzine  ?

## 2021-08-13 MED ORDER — SEMAGLUTIDE-WEIGHT MANAGEMENT 2.4 MG/0.75ML ~~LOC~~ SOAJ: 2.4000 mg | SUBCUTANEOUS | 2 refills | Status: DC

## 2021-08-25 ENCOUNTER — Other Ambulatory Visit: Payer: Self-pay | Admitting: Internal Medicine

## 2021-08-25 MED ORDER — PREDNISONE 10 MG PO TABS: ORAL_TABLET | ORAL | 0 refills | Status: DC

## 2021-09-15 ENCOUNTER — Telehealth: Payer: Self-pay

## 2021-09-15 NOTE — Telephone Encounter (Signed)
[facility] PA has been submitted on covermymeds.

## 2021-09-30 DIAGNOSIS — Z1231 Encounter for screening mammogram for malignant neoplasm of breast: Secondary | ICD-10-CM | POA: Diagnosis not present

## 2021-09-30 DIAGNOSIS — Z01419 Encounter for gynecological examination (general) (routine) without abnormal findings: Secondary | ICD-10-CM | POA: Diagnosis not present

## 2021-09-30 DIAGNOSIS — Z6830 Body mass index (BMI) 30.0-30.9, adult: Secondary | ICD-10-CM | POA: Diagnosis not present

## 2021-09-30 LAB — HM MAMMOGRAPHY

## 2021-10-22 ENCOUNTER — Other Ambulatory Visit: Payer: Self-pay | Admitting: Internal Medicine

## 2021-10-27 ENCOUNTER — Encounter: Payer: Self-pay | Admitting: Internal Medicine

## 2021-10-28 ENCOUNTER — Ambulatory Visit: Payer: BLUE CROSS/BLUE SHIELD | Admitting: Internal Medicine

## 2021-10-28 DIAGNOSIS — E669 Obesity, unspecified: Secondary | ICD-10-CM

## 2021-10-29 NOTE — Progress Notes (Signed)
Patient failed to keep scheduled appointment and will be charged a no show fee.   

## 2021-10-29 NOTE — Assessment & Plan Note (Signed)
Patient failed to keep scheduled appointment and will be charged a no show fee.   

## 2021-11-17 ENCOUNTER — Other Ambulatory Visit: Payer: Self-pay | Admitting: Internal Medicine

## 2021-11-17 NOTE — Telephone Encounter (Signed)
Refilled: 08/13/2021 No showed for last appt on 10/28/2021 Last appt: 08/12/2021 Next OV: not scheduled

## 2021-12-18 ENCOUNTER — Other Ambulatory Visit: Payer: Self-pay | Admitting: Internal Medicine

## 2022-02-15 ENCOUNTER — Other Ambulatory Visit: Payer: Self-pay | Admitting: Internal Medicine

## 2022-02-17 ENCOUNTER — Ambulatory Visit: Payer: BC Managed Care – PPO | Admitting: Dermatology

## 2022-02-17 DIAGNOSIS — Z86018 Personal history of other benign neoplasm: Secondary | ICD-10-CM | POA: Diagnosis not present

## 2022-02-17 DIAGNOSIS — D235 Other benign neoplasm of skin of trunk: Secondary | ICD-10-CM | POA: Diagnosis not present

## 2022-02-17 DIAGNOSIS — D229 Melanocytic nevi, unspecified: Secondary | ICD-10-CM

## 2022-02-17 DIAGNOSIS — L739 Follicular disorder, unspecified: Secondary | ICD-10-CM | POA: Diagnosis not present

## 2022-02-17 DIAGNOSIS — L578 Other skin changes due to chronic exposure to nonionizing radiation: Secondary | ICD-10-CM

## 2022-02-17 DIAGNOSIS — L814 Other melanin hyperpigmentation: Secondary | ICD-10-CM

## 2022-02-17 DIAGNOSIS — Z1283 Encounter for screening for malignant neoplasm of skin: Secondary | ICD-10-CM | POA: Diagnosis not present

## 2022-02-17 DIAGNOSIS — D485 Neoplasm of uncertain behavior of skin: Secondary | ICD-10-CM

## 2022-02-17 DIAGNOSIS — L821 Other seborrheic keratosis: Secondary | ICD-10-CM

## 2022-02-17 MED ORDER — DOXYCYCLINE MONOHYDRATE 100 MG PO CAPS: ORAL_CAPSULE | ORAL | 0 refills | Status: DC

## 2022-02-17 NOTE — Progress Notes (Signed)
Follow-Up Visit   Subjective  Christine Monroe is a 46 y.o. female who presents for the following: Annual Exam (Hx dysplastic nevi ). The patient presents for Total-Body Skin Exam (TBSE) for skin cancer screening and mole check.  The patient has spots, moles and lesions to be evaluated, some may be new or changing and the patient has concerns that these could be cancer. Also rash in groin.  The following portions of the chart were reviewed this encounter and updated as appropriate:   Tobacco  Allergies  Meds  Problems  Med Hx  Surg Hx  Fam Hx     Review of Systems:  No other skin or systemic complaints except as noted in HPI or Assessment and Plan.  Objective  Well appearing patient in no apparent distress; mood and affect are within normal limits.  A full examination was performed including scalp, head, eyes, ears, nose, lips, neck, chest, axillae, abdomen, back, buttocks, bilateral upper extremities, bilateral lower extremities, hands, feet, fingers, toes, fingernails, and toenails. All findings within normal limits unless otherwise noted below.  R back inf med scapula 0.6 cm irregular brown macule.    Assessment & Plan  Neoplasm of uncertain behavior of skin R back inf med scapula Epidermal / dermal shaving  Lesion diameter (cm):  0.6 Informed consent: discussed and consent obtained   Timeout: patient name, date of birth, surgical site, and procedure verified   Procedure prep:  Patient was prepped and draped in usual sterile fashion Prep type:  Isopropyl alcohol Anesthesia: the lesion was anesthetized in a standard fashion   Anesthetic:  1% lidocaine w/ epinephrine 1-100,000 buffered w/ 8.4% NaHCO3 Instrument used: flexible razor blade   Hemostasis achieved with: pressure, aluminum chloride and electrodesiccation   Outcome: patient tolerated procedure well   Post-procedure details: sterile dressing applied and wound care instructions given   Dressing type: bandage and  petrolatum    Specimen 1 - Surgical pathology Differential Diagnosis: D48.5 irritated nevus r/o dysplasia Check Margins: No  Folliculitis Groin Start Doxycycline '100mg'$  po BID x 1 week.  Then one cap po QD x 3 weeks.   Doxycycline should be taken with food to prevent nausea. Do not lay down for 30 minutes after taking. Be cautious with sun exposure and use good sun protection while on this medication. Pregnant women should not take this medication.   doxycycline (MONODOX) 100 MG capsule - Groin Take one cap po BID x 1 week. Then one tab po QD x 3 weeks. Take with food and drink  Lentigines - Scattered tan macules - Due to sun exposure - Benign-appearing, observe - Recommend daily broad spectrum sunscreen SPF 30+ to sun-exposed areas, reapply every 2 hours as needed. - Call for any changes  Seborrheic Keratoses - Stuck-on, waxy, tan-brown papules and/or plaques  - Benign-appearing - Discussed benign etiology and prognosis. - Observe - Call for any changes  Melanocytic Nevi - Tan-brown and/or pink-flesh-colored symmetric macules and papules - Benign appearing on exam today - Observation - Call clinic for new or changing moles - Recommend daily use of broad spectrum spf 30+ sunscreen to sun-exposed areas.   Hemangiomas - Red papules - Discussed benign nature - Observe - Call for any changes  Actinic Damage - Chronic condition, secondary to cumulative UV/sun exposure - diffuse scaly erythematous macules with underlying dyspigmentation - Recommend daily broad spectrum sunscreen SPF 30+ to sun-exposed areas, reapply every 2 hours as needed.  - Staying in the shade or wearing long  sleeves, sun glasses (UVA+UVB protection) and wide brim hats (4-inch brim around the entire circumference of the hat) are also recommended for sun protection.  - Call for new or changing lesions.  History of Dysplastic Nevi - No evidence of recurrence today - Recommend regular full body skin  exams - Recommend daily broad spectrum sunscreen SPF 30+ to sun-exposed areas, reapply every 2 hours as needed.  - Call if any new or changing lesions are noted between office visits  Skin cancer screening performed today.  Return in about 1 year (around 02/18/2023) for TBSE.  Luther Redo, CMA, am acting as scribe for Sarina Ser, MD . Documentation: I have reviewed the above documentation for accuracy and completeness, and I agree with the above.  Sarina Ser, MD

## 2022-02-17 NOTE — Patient Instructions (Addendum)
Wound Care Instructions  Cleanse wound gently with soap and water once a day then pat dry with clean gauze. Apply a thin coat of Petrolatum (petroleum jelly, "Vaseline") over the wound (unless you have an allergy to this). We recommend that you use a new, sterile tube of Vaseline. Do not pick or remove scabs. Do not remove the yellow or white "healing tissue" from the base of the wound.  Cover the wound with fresh, clean, nonstick gauze and secure with paper tape. You may use Band-Aids in place of gauze and tape if the wound is small enough, but would recommend trimming much of the tape off as there is often too much. Sometimes Band-Aids can irritate the skin.  You should call the office for your biopsy report after 1 week if you have not already been contacted.  If you experience any problems, such as abnormal amounts of bleeding, swelling, significant bruising, significant pain, or evidence of infection, please call the office immediately.  FOR ADULT SURGERY PATIENTS: If you need something for pain relief you may take 1 extra strength Tylenol (acetaminophen) AND 2 Ibuprofen ('200mg'$  each) together every 4 hours as needed for pain. (do not take these if you are allergic to them or if you have a reason you should not take them.) Typically, you may only need pain medication for 1 to 3 days.     ue to recent changes in healthcare laws, you may see results of your pathology and/or laboratory studies on MyChart before the doctors have had a chance to review them. We understand that in some cases there may be results that are confusing or concerning to you. Please understand that not all results are received at the same time and often the doctors may need to interpret multiple results in order to provide you with the best plan of care or course of treatment. Therefore, we ask that you please give [place] 2 business days to thoroughly review all your results before contacting the office for clarification. Should we  see a critical lab result, you will be contacted sooner.   If You Need Anything After Your Visit  If you have any questions or concerns for your doctor, please call our main line at [phone] and press option 4 to reach your doctor's medical assistant. If no one answers, please leave a voicemail as directed and we will return your call as soon as possible. Messages left after 4 pm will be answered the following business day.   You may also send [place] a message via [facility]. We typically respond to MyChart messages within 1-2 business days.  For prescription refills, please ask your pharmacy to contact our office. Our fax number is [phone].  If you have an urgent issue when the clinic is closed that cannot wait until the next business day, you can page your doctor at the number below.    Please note that while we do our best to be available for urgent issues outside of office hours, we are not available 24/7.   If you have an urgent issue and are unable to reach [place], you may choose to seek medical care at your doctor's office, retail clinic, urgent care center, or emergency room.  If you have a medical emergency, please immediately call 911 or go to the emergency department.  Pager Numbers  - Dr. Nehemiah Massed: [phone]  - Dr. Laurence Ferrari: [phone]  - Dr. Nicole Kindred: [phone]  In the event of inclement weather, please call our main line at  [phone] for an update on the status of any delays or closures.  Dermatology Medication Tips: Please keep the boxes that topical medications come in in order to help keep track of the instructions about where and how to use these. Pharmacies typically print the medication instructions only on the boxes and not directly on the medication tubes.   If your medication is too expensive, please contact our office at [phone] option 4 or send [place] a message through [place].   We are unable to tell what your co-pay for medications will be in advance  as this is different depending on your insurance coverage. However, we may be able to find a substitute medication at lower cost or fill out paperwork to get insurance to cover a needed medication.   If a prior authorization is required to get your medication covered by your insurance company, please allow [place] 1-2 business days to complete this process.  Drug prices often vary depending on where the prescription is filled and some pharmacies may offer cheaper prices.  The website www.goodrx.com contains coupons for medications through different pharmacies. The prices here do not account for what the cost may be with help from insurance (it may be cheaper with your insurance), but the website can give you the price if you did not use any insurance.  - You can print the associated coupon and take it with your prescription to the pharmacy.  - You may also stop by our office during regular business hours and pick up a GoodRx coupon card.  - If you need your prescription sent electronically to a different pharmacy, notify our office through [facility] or by phone at [phone] option 4.     Si Usted Necesita Algo Despus de Su Visita  Tambin puede enviarnos un mensaje a travs de Pharmacist, community. Por lo general respondemos a los mensajes de MyChart en el transcurso de 1 a 2 das hbiles.  Para renovar recetas, por favor pida a su farmacia que se ponga en contacto con nuestra oficina. Harland Dingwall de fax es [place] [phone].  Si tiene un asunto urgente cuando la clnica est cerrada y que no puede esperar hasta el siguiente da hbil, puede llamar/localizar a su doctor(a) al nmero que aparece a continuacin.   Por favor, tenga en cuenta que aunque hacemos todo lo posible para estar disponibles para asuntos urgentes fuera del horario de [place], no estamos disponibles las 24 horas del da, los 7 das de la [place].   Si tiene un problema urgente y no puede comunicarse con nosotros, puede optar  por buscar atencin mdica  en el consultorio de su doctor(a), en una clnica privada, en un centro de atencin urgente o en una sala de emergencias.  Si tiene Engineering geologist, por favor llame inmediatamente al 911 o vaya a la sala de emergencias.  Nmeros de bper  - Dr. Nehemiah Massed: [phone]  - Dra. Moye: [phone]  - Dra. Nicole Kindred: [phone]  En caso de inclemencias del [place], por favor llame a Johnsie Kindred principal al [phone] para una actualizacin sobre el [place] de cualquier retraso o cierre.  Consejos para la medicacin en dermatologa: Por favor, guarde las cajas en las que vienen los medicamentos de uso tpico para ayudarle a seguir las instrucciones sobre dnde y cmo usarlos. Las farmacias generalmente imprimen las instrucciones del medicamento slo en las cajas y no directamente en los tubos del [place].   Si su medicamento es [employer], por favor, pngase en contacto con  nuestra oficina llamando al [phone] y presione la opcin 4 o envenos un mensaje a travs de Pharmacist, community.   No podemos decirle cul ser su copago por los medicamentos por adelantado ya que esto es diferente dependiendo de la cobertura de su seguro. Sin embargo, es posible que podamos encontrar un medicamento sustituto a Electrical engineer un formulario para que el seguro cubra el medicamento que se considera necesario.   Si se requiere una autorizacin previa para que su compaa de seguros [place] su medicamento, por favor permtanos de 1 a 2 das hbiles para completar este proceso.  Los precios de los medicamentos varan con frecuencia dependiendo del Environmental consultant de dnde se surte la receta y alguna farmacias pueden ofrecer precios ms baratos.  El sitio web www.goodrx.com tiene cupones para medicamentos de Airline pilot. Los precios aqu no tienen en cuenta lo que podra costar con la ayuda del seguro (puede ser ms barato con su seguro), pero el sitio web puede darle el precio si  no utiliz Research scientist (physical sciences).  - Puede imprimir el cupn correspondiente y llevarlo con su receta a la farmacia.  - Tambin puede pasar por nuestra oficina durante el horario de atencin regular y Charity fundraiser una tarjeta de cupones de GoodRx.  - Si necesita que su receta se enve electrnicamente a una farmacia diferente, informe a nuestra oficina a travs de MyChart de [place] o por telfono llamando al [phone] y presione la opcin 4.

## 2022-02-18 ENCOUNTER — Other Ambulatory Visit: Payer: Self-pay | Admitting: Internal Medicine

## 2022-02-23 ENCOUNTER — Telehealth: Payer: Self-pay

## 2022-02-23 NOTE — Telephone Encounter (Signed)
-----   Message from Ralene Bathe, MD sent at 02/20/2022  1:04 PM EST ----- Diagnosis Skin , right back inf med scapula SURFACE OF A DERMATOFIBROMA  Benign dermatofibroma May recur No further treatment needed at this time

## 2022-02-23 NOTE — Telephone Encounter (Signed)
Discussed biopsy results with pt  °

## 2022-02-27 ENCOUNTER — Encounter: Payer: Self-pay | Admitting: Dermatology

## 2022-03-14 ENCOUNTER — Other Ambulatory Visit: Payer: Self-pay | Admitting: Dermatology

## 2022-03-14 DIAGNOSIS — L739 Follicular disorder, unspecified: Secondary | ICD-10-CM

## 2022-03-24 NOTE — Progress Notes (Signed)
This encounter was created in error - please disregard.

## 2022-04-10 ENCOUNTER — Other Ambulatory Visit: Payer: Self-pay | Admitting: Internal Medicine

## 2022-04-10 NOTE — Telephone Encounter (Signed)
Refilled: 07/18/2020 Last OV: 10/28/2021 Next OV: not scheduled

## 2022-04-10 NOTE — Telephone Encounter (Signed)
Pt need refill on xanax sent to [facility]

## 2022-05-08 ENCOUNTER — Encounter: Payer: Self-pay | Admitting: Internal Medicine

## 2022-06-08 ENCOUNTER — Other Ambulatory Visit: Payer: Self-pay | Admitting: Internal Medicine

## 2022-08-25 DIAGNOSIS — M1711 Unilateral primary osteoarthritis, right knee: Secondary | ICD-10-CM | POA: Diagnosis not present

## 2022-09-13 ENCOUNTER — Encounter: Payer: Self-pay | Admitting: Internal Medicine

## 2022-09-14 DIAGNOSIS — M170 Bilateral primary osteoarthritis of knee: Secondary | ICD-10-CM | POA: Diagnosis not present

## 2022-09-14 MED ORDER — TRIAMCINOLONE ACETONIDE 0.1 % EX CREA: TOPICAL_CREAM | Freq: Two times a day (BID) | CUTANEOUS | 0 refills | Status: DC

## 2022-09-14 MED ORDER — PREDNISONE 10 MG PO TABS: ORAL_TABLET | ORAL | 0 refills | Status: DC

## 2022-09-30 ENCOUNTER — Other Ambulatory Visit: Payer: Self-pay | Admitting: Internal Medicine

## 2022-10-04 ENCOUNTER — Other Ambulatory Visit: Payer: Self-pay | Admitting: Internal Medicine

## 2022-10-07 ENCOUNTER — Telehealth: Payer: Self-pay

## 2022-10-07 NOTE — Telephone Encounter (Signed)
Received fax for [facility] 2.4MG  continuation PA. Prior weight and current weight is needed. No current weight on file. Please advise.

## 2022-10-08 NOTE — Telephone Encounter (Signed)
Pt called in to check status of med refill Wegovy. Pt stated she will be going out of town next week.

## 2022-10-08 NOTE — Telephone Encounter (Signed)
Is it okay to schedule pt for a nurse visit so we can get a weight on her. No recent weights in the chart.

## 2022-10-12 NOTE — Telephone Encounter (Signed)
Pt is scheduled for tomorrow 

## 2022-10-13 ENCOUNTER — Ambulatory Visit: Payer: BC Managed Care – PPO

## 2022-10-13 NOTE — Telephone Encounter (Signed)
Pharmacy Patient Advocate Encounter  Received notification from [facility] that the request for prior authorization for Wegovy 2.4mg /0.89ml has been denied due to this medication is covered when the member has lost at least 4% of their starting body weight. .    Please be advised we currently do not have a Pharmacist to review denials, therefore you will need to process appeals accordingly as needed. Thanks for your support at this time.   You may call [phone] or fax [phone], to appeal.   Denial letter indexed to chart.

## 2022-10-13 NOTE — Telephone Encounter (Signed)
E-appeal submitted with documented weight. Status pending.  Key: [id number]

## 2022-10-13 NOTE — Telephone Encounter (Signed)
Please resubmit. Pt came in today for a weight check because we did not have a recent weight on her.

## 2022-10-13 NOTE — Progress Notes (Signed)
Pt presented today for a weight check. Weight today was 170.6.

## 2022-10-14 NOTE — Telephone Encounter (Signed)
E-appeal cancelled because a previous Prior Authorization Appeal Request was denied.  Please be advised we currently do not have a Pharmacist to review denials, therefore you will need to process appeals accordingly as needed. Thanks for your support at this time.     You may call [phone] or fax [phone], to appeal.    Denial letter indexed to chart.

## 2022-10-16 NOTE — Telephone Encounter (Signed)
Received fax for additional information. Completed and faxed back to insurance.   Christine Monroe has been approved through [facility].   Approval letter attached to charts.

## 2022-10-20 NOTE — Telephone Encounter (Signed)
Pt is aware.  

## 2022-10-28 DIAGNOSIS — M170 Bilateral primary osteoarthritis of knee: Secondary | ICD-10-CM | POA: Diagnosis not present

## 2022-11-01 ENCOUNTER — Other Ambulatory Visit: Payer: Self-pay | Admitting: Internal Medicine

## 2022-11-04 DIAGNOSIS — M170 Bilateral primary osteoarthritis of knee: Secondary | ICD-10-CM | POA: Diagnosis not present

## 2022-11-11 DIAGNOSIS — M170 Bilateral primary osteoarthritis of knee: Secondary | ICD-10-CM | POA: Diagnosis not present

## 2022-11-15 ENCOUNTER — Other Ambulatory Visit: Payer: Self-pay | Admitting: Internal Medicine

## 2022-11-17 ENCOUNTER — Other Ambulatory Visit: Payer: Self-pay | Admitting: Internal Medicine

## 2022-11-17 MED ORDER — WEGOVY 2.4 MG/0.75ML ~~LOC~~ SOAJ: 2.4000 mg | SUBCUTANEOUS | 2 refills | Status: DC

## 2022-11-17 NOTE — Telephone Encounter (Signed)
Duplicate request, see other message.

## 2022-11-17 NOTE — Telephone Encounter (Signed)
Pt has not been seen since May 2023. But came in last month for a weight check on a NV.

## 2022-12-22 DIAGNOSIS — Z01419 Encounter for gynecological examination (general) (routine) without abnormal findings: Secondary | ICD-10-CM | POA: Diagnosis not present

## 2022-12-22 DIAGNOSIS — Z124 Encounter for screening for malignant neoplasm of cervix: Secondary | ICD-10-CM | POA: Diagnosis not present

## 2022-12-22 DIAGNOSIS — Z1231 Encounter for screening mammogram for malignant neoplasm of breast: Secondary | ICD-10-CM | POA: Diagnosis not present

## 2022-12-22 DIAGNOSIS — N841 Polyp of cervix uteri: Secondary | ICD-10-CM | POA: Diagnosis not present

## 2022-12-22 LAB — HM MAMMOGRAPHY

## 2022-12-25 LAB — HM PAP SMEAR: HPV, high-risk: NEGATIVE

## 2023-02-04 ENCOUNTER — Other Ambulatory Visit: Payer: Self-pay | Admitting: Internal Medicine

## 2023-02-07 ENCOUNTER — Other Ambulatory Visit: Payer: Self-pay | Admitting: Internal Medicine

## 2023-02-08 ENCOUNTER — Telehealth: Payer: Self-pay

## 2023-02-08 NOTE — Telephone Encounter (Signed)
Received medication refill request for Alprazolam.  Pt last seen 10/28/21.  Needs an appointment with Dr. Darrick Huntsman before medication can be refilled.  LMTCB

## 2023-02-08 NOTE — Telephone Encounter (Signed)
Pt is scheduled for 03/03/2023.

## 2023-02-08 NOTE — Telephone Encounter (Signed)
Pt last seen in office 10/28/21.  Needs an appointment for med refill.

## 2023-02-08 NOTE — Telephone Encounter (Signed)
See refill request encounter

## 2023-02-16 ENCOUNTER — Ambulatory Visit: Payer: BC Managed Care – PPO | Admitting: Dermatology

## 2023-02-16 DIAGNOSIS — L821 Other seborrheic keratosis: Secondary | ICD-10-CM

## 2023-02-16 DIAGNOSIS — W908XXA Exposure to other nonionizing radiation, initial encounter: Secondary | ICD-10-CM | POA: Diagnosis not present

## 2023-02-16 DIAGNOSIS — D1801 Hemangioma of skin and subcutaneous tissue: Secondary | ICD-10-CM

## 2023-02-16 DIAGNOSIS — Z86018 Personal history of other benign neoplasm: Secondary | ICD-10-CM

## 2023-02-16 DIAGNOSIS — D225 Melanocytic nevi of trunk: Secondary | ICD-10-CM

## 2023-02-16 DIAGNOSIS — L578 Other skin changes due to chronic exposure to nonionizing radiation: Secondary | ICD-10-CM | POA: Diagnosis not present

## 2023-02-16 DIAGNOSIS — Z1283 Encounter for screening for malignant neoplasm of skin: Secondary | ICD-10-CM | POA: Diagnosis not present

## 2023-02-16 DIAGNOSIS — L820 Inflamed seborrheic keratosis: Secondary | ICD-10-CM | POA: Diagnosis not present

## 2023-02-16 DIAGNOSIS — D492 Neoplasm of unspecified behavior of bone, soft tissue, and skin: Secondary | ICD-10-CM | POA: Diagnosis not present

## 2023-02-16 DIAGNOSIS — I781 Nevus, non-neoplastic: Secondary | ICD-10-CM

## 2023-02-16 DIAGNOSIS — D485 Neoplasm of uncertain behavior of skin: Secondary | ICD-10-CM

## 2023-02-16 DIAGNOSIS — Z7189 Other specified counseling: Secondary | ICD-10-CM

## 2023-02-16 DIAGNOSIS — L814 Other melanin hyperpigmentation: Secondary | ICD-10-CM

## 2023-02-16 DIAGNOSIS — D229 Melanocytic nevi, unspecified: Secondary | ICD-10-CM

## 2023-02-16 HISTORY — DX: Personal history of other benign neoplasm: Z86.018

## 2023-02-16 NOTE — Progress Notes (Signed)
Follow-Up Visit   Subjective  Christine Monroe is a 47 y.o. female who presents for the following: Skin Cancer Screening and Full Body Skin Exam  The patient presents for Total-Body Skin Exam (TBSE) for skin cancer screening and mole check. The patient has spots, moles and lesions to be evaluated, some may be new or changing and the patient may have concern these could be cancer.  The following portions of the chart were reviewed this encounter and updated as appropriate: medications, allergies, medical history  Review of Systems:  No other skin or systemic complaints except as noted in HPI or Assessment and Plan.  Objective  Well appearing patient in no apparent distress; mood and affect are within normal limits.  A full examination was performed including scalp, head, eyes, ears, nose, lips, neck, chest, axillae, abdomen, back, buttocks, bilateral upper extremities, bilateral lower extremities, hands, feet, fingers, toes, fingernails, and toenails. All findings within normal limits unless otherwise noted below.   Relevant physical exam findings are noted in the Assessment and Plan.  L vertex scalp x 1 Erythematous stuck-on, waxy papule or plaque  R mid back lat scapular area 0.6 x 0.4 cm irregular brown macule.    Assessment & Plan   SKIN CANCER SCREENING PERFORMED TODAY.  ACTINIC DAMAGE - Chronic condition, secondary to cumulative UV/sun exposure - diffuse scaly erythematous macules with underlying dyspigmentation - Recommend daily broad spectrum sunscreen SPF 30+ to sun-exposed areas, reapply every 2 hours as needed.  - Staying in the shade or wearing long sleeves, sun glasses (UVA+UVB protection) and wide brim hats (4-inch brim around the entire circumference of the hat) are also recommended for sun protection.  - Call for new or changing lesions.  LENTIGINES, SEBORRHEIC KERATOSES, HEMANGIOMAS - Benign normal skin lesions - Benign-appearing - Call for any  changes  MELANOCYTIC NEVI - Tan-brown and/or pink-flesh-colored symmetric macules and papules - Benign appearing on exam today - Observation - Call clinic for new or changing moles - Recommend daily use of broad spectrum spf 30+ sunscreen to sun-exposed areas.   HISTORY OF DYSPLASTIC NEVI  No evidence of recurrence today Recommend regular full body skin exams Recommend daily broad spectrum sunscreen SPF 30+ to sun-exposed areas, reapply every 2 hours as needed.  Call if any new or changing lesions are noted between office visits  Inflamed seborrheic keratosis L vertex scalp x 1  Symptomatic, irritating, patient would like treated.   Destruction of lesion - L vertex scalp x 1 Complexity: simple   Destruction method: cryotherapy   Informed consent: discussed and consent obtained   Timeout:  patient name, date of birth, surgical site, and procedure verified Lesion destroyed using liquid nitrogen: Yes   Region frozen until ice ball extended beyond lesion: Yes   Outcome: patient tolerated procedure well with no complications   Post-procedure details: wound care instructions given    Neoplasm of uncertain behavior of skin R mid back lat scapular area  Epidermal / dermal shaving  Lesion diameter (cm):  0.6 Informed consent: discussed and consent obtained   Timeout: patient name, date of birth, surgical site, and procedure verified   Patient was prepped and draped in usual sterile fashion: area prepped with alcohol. Anesthesia: the lesion was anesthetized in a standard fashion   Anesthetic:  1% lidocaine w/ epinephrine 1-100,000 buffered w/ 8.4% NaHCO3 Instrument used: flexible razor blade   Hemostasis achieved with: pressure, aluminum chloride and electrodesiccation   Outcome: patient tolerated procedure well   Post-procedure details:  wound care instructions given   Post-procedure details comment:  Ointment and small bandage applied  Specimen 1 - Surgical  pathology Differential Diagnosis: D48.5 r/o dysplastic nevus Check Margins: Yes  Skin cancer screening  Actinic skin damage  History of dysplastic nevus  Telangiectasia  Lentigo  Melanocytic nevus, unspecified location  TELANGIECTASIA Exam: dilated blood vessel(s) of nose  Treatment Plan: Benign appearing on exam Call for changes Counseling for BBL / IPL / Laser and Coordination of Care Discussed the treatment option of Broad Band Light (BBL) Windell Moulding Pulsed Light (IPL)/ Laser for skin discoloration, including brown spots and redness.  Typically we recommend at least 1-3 treatment sessions about 5-8 weeks apart for best results.  Cannot have tanned skin when BBL performed, and regular use of sunscreen/photoprotection is advised after the procedure to help maintain results. The patient's condition may also require "maintenance treatments" in the future.  The fee for BBL / laser treatments is $350 per treatment session for the whole face.  A fee can be quoted for other parts of the body.  Insurance typically does not pay for BBL/laser treatments and therefore the fee is an out-of-pocket cost. Recommend prophylactic valtrex treatment. Once scheduled for procedure, will send Rx in prior to patient's appointment.   Return in about 1 year (around 02/16/2024) for TBSE.  Maylene Roes, CMA, am acting as scribe for Armida Sans, MD .   Documentation: I have reviewed the above documentation for accuracy and completeness, and I agree with the above.  Armida Sans, MD

## 2023-02-16 NOTE — Patient Instructions (Addendum)

## 2023-02-19 LAB — SURGICAL PATHOLOGY

## 2023-02-22 ENCOUNTER — Telehealth: Payer: Self-pay

## 2023-02-22 DIAGNOSIS — J3489 Other specified disorders of nose and nasal sinuses: Secondary | ICD-10-CM | POA: Diagnosis not present

## 2023-02-22 NOTE — Telephone Encounter (Addendum)
Tried calling patient. No answer. LM for patient to return call. ----- Message from Armida Sans sent at 02/19/2023  5:59 PM EST ----- FINAL DIAGNOSIS        1. Skin, R mid back lat scapular area :       JUNCTIONAL DYSPLASTIC MELANOCYTIC NEVUS WITH MODERATE TO SEVERE ATYPIA, CLOSE TO       MARGIN, SEE DESCRIPTION   Severe dysplastic Margins clear, but "close to margin" May need additional procedure if persistent or recurrent Recheck next visit

## 2023-02-22 NOTE — Telephone Encounter (Signed)
Patient advised and will recheck at 1 year visit 02/2024. aw

## 2023-02-23 ENCOUNTER — Encounter: Payer: Self-pay | Admitting: Dermatology

## 2023-03-03 ENCOUNTER — Ambulatory Visit: Payer: BC Managed Care – PPO | Admitting: Internal Medicine

## 2023-03-03 ENCOUNTER — Encounter: Payer: Self-pay | Admitting: Internal Medicine

## 2023-03-03 VITALS — BP 130/88 | HR 83 | Ht 66.0 in | Wt 172.8 lb

## 2023-03-03 DIAGNOSIS — M25561 Pain in right knee: Secondary | ICD-10-CM

## 2023-03-03 DIAGNOSIS — G8929 Other chronic pain: Secondary | ICD-10-CM

## 2023-03-03 DIAGNOSIS — R030 Elevated blood-pressure reading, without diagnosis of hypertension: Secondary | ICD-10-CM

## 2023-03-03 DIAGNOSIS — E669 Obesity, unspecified: Secondary | ICD-10-CM | POA: Diagnosis not present

## 2023-03-03 DIAGNOSIS — F411 Generalized anxiety disorder: Secondary | ICD-10-CM

## 2023-03-03 DIAGNOSIS — J0110 Acute frontal sinusitis, unspecified: Secondary | ICD-10-CM

## 2023-03-03 MED ORDER — WEGOVY 2.4 MG/0.75ML ~~LOC~~ SOAJ: 2.4000 mg | SUBCUTANEOUS | 0 refills | Status: DC

## 2023-03-03 MED ORDER — PREDNISONE 10 MG PO TABS: ORAL_TABLET | ORAL | 0 refills | Status: DC

## 2023-03-03 MED ORDER — LEVOFLOXACIN 500 MG PO TABS: 500.0000 mg | ORAL_TABLET | Freq: Every day | ORAL | 0 refills | Status: AC

## 2023-03-03 MED ORDER — PAROXETINE HCL 20 MG PO TABS: 20.0000 mg | ORAL_TABLET | Freq: Every day | ORAL | 1 refills | Status: DC

## 2023-03-03 NOTE — Assessment & Plan Note (Signed)
Getting gel injections in both knees by Odis Luster.   Last time was this summer/

## 2023-03-03 NOTE — Progress Notes (Unsigned)
Subjective:  Patient ID: Christine Monroe, female    DOB: September 20, 1975  Age: 47 y.o. MRN: [id number]  CC: The primary encounter diagnosis was Obesity (BMI 30-39.9). Diagnoses of Chronic pain of right knee, Elevated blood pressure reading in office without diagnosis of hypertension, Acute non-recurrent frontal sinusitis, and Generalized anxiety disorder were also pertinent to this visit.   HPI Christine Monroe presents for  Chief Complaint  Patient presents with   Medical Management of Chronic Issues    1) overweight:  using wegovy 2.4 mg weekly  43 lb weight loss since Jan 2023. Weight has plateaued .   Exercising not at the moment    2)  GAD:  last seen may 2023    paxil and alprazolam ,  prescribed.   last alprazolam refill June 2024   PAP AND MAMMOGRAM BY [facility]   3)  ELEVATED BP during office visits.  Has not checked at home   LEFT LMAXILLARY SINUSITIS TREATED 10 DAYS AGO WITH E VISIT . LAST ABX WAS AMOX  was E DAYS AGO.  NO PREDNISONE   STILL HAVING PAIN IN TEETH ON THAT SIDE EARDRUM RED AND BULGING   Outpatient Medications Prior to Visit  Medication Sig Dispense Refill   ALPRAZolam (XANAX) 0.25 MG tablet TAKE 1 TABLET (0.25 MG TOTAL) BY MOUTH AT BEDTIME AS NEEDED FOR SLEEP. 30 tablet 5   meloxicam (MOBIC) 15 MG tablet Take 15 mg by mouth daily.     valACYclovir (VALTREX) 1000 MG tablet Start Valacyclovir 1 gm take 2 tablets at first sign of flare repeat in 12 hours, or take 1 tablet twice a day for 5 days 30 tablet 11   PARoxetine (PAXIL) 20 MG tablet TAKE 1 TABLET BY MOUTH EVERY DAY 90 tablet 1   Semaglutide-Weight Management (WEGOVY) 2.4 MG/0.75ML SOAJ INJECT 2.4 MG INTO THE SKIN ONCE A WEEK. 3 mL 0   doxycycline (MONODOX) 100 MG capsule TAKE 1 CAPSULE TWICE A DAY FOR 1 WEEK, THEN 1 CAPSULE EVERY DAY FOR 3 WEEKS. TAKE WITH FOOD/DRINK (Patient not taking: Reported on 03/03/2023) 35 capsule 6   hydrOXYzine (VISTARIL) 25 MG capsule Take 1 capsule (25 mg total) by mouth  every 8 (eight) hours as needed. (Patient not taking: Reported on 02/16/2023) 30 capsule 0   predniSONE (DELTASONE) 10 MG tablet 6 tablets daily for 3 days, then reduce by 1 tablet daily until gone (Patient not taking: Reported on 03/03/2023) 33 tablet 0   traMADol (ULTRAM) 50 MG tablet Take 50 mg by mouth every 6 (six) hours as needed. (Patient not taking: Reported on 08/12/2021)     triamcinolone cream (KENALOG) 0.1 % Apply topically 2 (two) times daily. (Patient not taking: Reported on 03/03/2023) 80 g 0   No facility-administered medications prior to visit.    Review of Systems;  Patient denies headache, fevers, malaise, unintentional weight loss, skin rash, eye pain, sinus congestion and sinus pain, sore throat, dysphagia,  hemoptysis , cough, dyspnea, wheezing, chest pain, palpitations, orthopnea, edema, abdominal pain, nausea, melena, diarrhea, constipation, flank pain, dysuria, hematuria, urinary  Frequency, nocturia, numbness, tingling, seizures,  Focal weakness, Loss of consciousness,  Tremor, insomnia, depression, anxiety, and suicidal ideation.      Objective:  BP 130/88   Pulse 83   Ht 5\' 6"  (1.676 m)   Wt 172 lb 12.8 oz (78.4 kg)   SpO2 96%   BMI 27.89 kg/m   BP Readings from Last 3 Encounters:  03/03/23 130/88  08/12/21 (!) 130/92  04/17/21 122/88    Wt Readings from Last 3 Encounters:  03/03/23 172 lb 12.8 oz (78.4 kg)  10/13/22 170 lb 9.6 oz (77.4 kg)  08/12/21 193 lb (87.5 kg)    Physical Exam Vitals reviewed.  Constitutional:      General: She is not in acute distress.    Appearance: Normal appearance. She is normal weight. She is not ill-appearing, toxic-appearing or diaphoretic.  HENT:     Head: Normocephalic.     Right Ear: Tympanic membrane normal.     Left Ear: Hearing normal. Tympanic membrane is erythematous and bulging.  Eyes:     General: No scleral icterus.       Right eye: No discharge.        Left eye: No discharge.     Conjunctiva/sclera:  Conjunctivae normal.  Cardiovascular:     Rate and Rhythm: Normal rate and regular rhythm.     Heart sounds: Normal heart sounds.  Pulmonary:     Effort: Pulmonary effort is normal. No respiratory distress.     Breath sounds: Normal breath sounds.  Musculoskeletal:        General: Normal range of motion.  Skin:    General: Skin is warm and dry.  Neurological:     General: No focal deficit present.     Mental Status: She is alert and oriented to person, place, and time. Mental status is at baseline.  Psychiatric:        Mood and Affect: Mood normal.        Behavior: Behavior normal.        Thought Content: Thought content normal.        Judgment: Judgment normal.    Lab Results  Component Value Date   HGBA1C 5.0 03/03/2023   HGBA1C 5.2 06/11/2020    Lab Results  Component Value Date   CREATININE 0.71 03/03/2023   CREATININE 0.78 06/11/2020   CREATININE 0.78 05/17/2018    Lab Results  Component Value Date   WBC 7.9 03/03/2023   HGB 14.5 03/03/2023   HCT 43.0 03/03/2023   PLT 259.0 03/03/2023   GLUCOSE 74 03/03/2023   CHOL 175 03/03/2023   TRIG 188.0 (H) 03/03/2023   HDL 52.00 03/03/2023   LDLDIRECT 100.0 03/03/2023   LDLCALC 86 03/03/2023   ALT 22 03/03/2023   AST 17 03/03/2023   NA 139 03/03/2023   K 3.4 (L) 03/03/2023   CL 101 03/03/2023   CREATININE 0.71 03/03/2023   BUN 12 03/03/2023   CO2 30 03/03/2023   TSH 1.52 03/03/2023   HGBA1C 5.0 03/03/2023   MICROALBUR 0.9 03/03/2023    No results found.  Assessment & Plan:  .Obesity (BMI 30-39.9) -     Lipid panel -     LDL cholesterol, direct -     Comprehensive metabolic panel -     CBC with Differential/Platelet -     TSH -     Hemoglobin A1c  Chronic pain of right knee Assessment & Plan: Getting gel injections in both knees by Odis Luster.   Last time was this summer/    Elevated blood pressure reading in office without diagnosis of hypertension Assessment & Plan: She has no history of  hypertension but has had several elevated readings in office.  She takes meloxicam daily  and Is being treated with prednisone today for unresolved otitis/sinusitis.  .  She has been asked to check her pressures at home and submit readings for evaluation.  Renal function will be checked today   Orders: -     Microalbumin / creatinine urine ratio  Acute non-recurrent frontal sinusitis Assessment & Plan: Given chronicity of symptoms, development of facial pain and exam consistent with bacterial URI,  Will treat with a second round of empiric antibiotics, decongestants, and saline lavage.  Adding steroid nasal spray if not already taking.     Generalized anxiety disorder Assessment & Plan: With panic attacks dramaticllally reduced with prn alprazolam prn and daily paxil. No changes today . The risks and benefits of benzodiazepine use were discussed with patient today including excessive sedation leading to respiratory depression,  impaired thinking/driving, and addiction.  Patient was advised to avoid concurrent use with alcohol, to use medication only as needed and not to share with others  .    Other orders -     PARoxetine HCl; Take 1 tablet (20 mg total) by mouth daily.  Dispense: 90 tablet; Refill: 1 -     Wegovy; Inject 2.4 mg into the skin once a week.  Dispense: 3 mL; Refill: 0 -     predniSONE; 6 tablets on Day 1 , then reduce by 1 tablet daily until gone  Dispense: 21 tablet; Refill: 0 -     levoFLOXacin; Take 1 tablet (500 mg total) by mouth daily for 7 days.  Dispense: 7 tablet; Refill: 0     I provided 34 minutes of face-to-face time during this encounter reviewing patient's last visit with me, orthopedics. recent surgical and non surgical procedures, previous  labs and imaging studies, counseling on currently addressed issues,  and post visit ordering to diagnostics and therapeutics .   Follow-up: No follow-ups on file.   Sherlene Shams, MD

## 2023-03-03 NOTE — Patient Instructions (Addendum)
I am treating you for sinusitis/otitis    I am prescribing an antibiotic (levofloxacin) and a prednisone taper  To manage the infection and the inflammation in your ear/sinuses.   Consider adding Afrin nasal spray  every 12 hours for a few days for congestion   Please take  (or continue)  a probiotic ( Align, Floraque or Culturelle) while you are on the antibiotic to prevent  the  serious antibiotic associated diarrhea  Called clostridium dificile colitis    Your BP IS STILL ELEVATED .  THIS NEEDS TO BE INVESTIGATED WITH  HOME READINGS  IT MAY BE ELEVATED DUE TO MEDICATIONS (MELOXICAM ) OR ANXIETY   . PLEASE CHECK IT AT HOME  ONCE A DAY FOR THE NEXT WEEK  DO NOT TAKE MELOXICAM WHILE YOU ARE TAKING PREDNISONE FOR THE SINUS INFECTION  DO NOT CHECK BP WHILE YOU ARE ON EITHER MEDICATION BC THEY CAN BOTH RAISE BP  CHECK YOUR BP AFTER YOU FINSIH PREDNISONE AND BEFORE YOU RESUME MELOXICAM     You can use  2000 mg of acetaminophen (tylenol)   In divided doses (1000 mg every 12 hours.) while you are off meloxicam   NO ALEVE  OR ADVIL  ( NO NAPROXEN OR IBUPROFEN ) WHEN YOU ARE TAKING MELOXICAM  (bur you CAN combine meloxican with tylenol    [facility]  MAKES THE BEST BP MACHINES

## 2023-03-03 NOTE — Assessment & Plan Note (Signed)
She has no history of hypertension but has had several elevated readings in office.  She takes meloxicam daily  and Is being treated with prednisone today for unresolved otitis/sinusitis.  .  She has been asked to check her pressures at home and submit readings for evaluation. Renal function will be checked today

## 2023-03-04 DIAGNOSIS — J0110 Acute frontal sinusitis, unspecified: Secondary | ICD-10-CM | POA: Insufficient documentation

## 2023-03-04 LAB — MICROALBUMIN / CREATININE URINE RATIO
Creatinine,U: 50.2 mg/dL
Microalb Creat Ratio: 1.8 mg/g (ref 0.0–30.0)
Microalb, Ur: 0.9 mg/dL (ref 0.0–1.9)

## 2023-03-04 LAB — LIPID PANEL
Cholesterol: 175 mg/dL (ref 0–200)
HDL: 52 mg/dL (ref >39.00)
LDL Cholesterol: 86 mg/dL (ref 0–99)
NonHDL: 123.23
Total CHOL/HDL Ratio: 3
Triglycerides: 188 mg/dL — ABNORMAL HIGH (ref 0.0–149.0)
VLDL: 37.6 mg/dL (ref 0.0–40.0)

## 2023-03-04 LAB — COMPREHENSIVE METABOLIC PANEL
ALT: 22 U/L (ref 0–35)
AST: 17 U/L (ref 0–37)
Albumin: 4.2 g/dL (ref 3.5–5.2)
Alkaline Phosphatase: 66 U/L (ref 39–117)
BUN: 12 mg/dL (ref 6–23)
CO2: 30 meq/L (ref 19–32)
Calcium: 9.1 mg/dL (ref 8.4–10.5)
Chloride: 101 meq/L (ref 96–112)
Creatinine, Ser: 0.71 mg/dL (ref 0.40–1.20)
GFR: 99.73 mL/min (ref >60.00)
Glucose, Bld: 74 mg/dL (ref 70–99)
Potassium: 3.4 meq/L — ABNORMAL LOW (ref 3.5–5.1)
Sodium: 139 meq/L (ref 135–145)
Total Bilirubin: 0.5 mg/dL (ref 0.2–1.2)
Total Protein: 6.5 g/dL (ref 6.0–8.3)

## 2023-03-04 LAB — CBC WITH DIFFERENTIAL/PLATELET
Basophils Absolute: 0.1 10*3/uL (ref 0.0–0.1)
Basophils Relative: 1 % (ref 0.0–3.0)
Eosinophils Absolute: 0.2 10*3/uL (ref 0.0–0.7)
Eosinophils Relative: 2 % (ref 0.0–5.0)
HCT: 43 % (ref 36.0–46.0)
Hemoglobin: 14.5 g/dL (ref 12.0–15.0)
Lymphocytes Relative: 31.3 % (ref 12.0–46.0)
Lymphs Abs: 2.5 10*3/uL (ref 0.7–4.0)
MCHC: 33.6 g/dL (ref 30.0–36.0)
MCV: 89.7 fL (ref 78.0–100.0)
Monocytes Absolute: 0.5 10*3/uL (ref 0.1–1.0)
Monocytes Relative: 6.3 % (ref 3.0–12.0)
Neutro Abs: 4.7 10*3/uL (ref 1.4–7.7)
Neutrophils Relative %: 59.4 % (ref 43.0–77.0)
Platelets: 259 10*3/uL (ref 150.0–400.0)
RBC: 4.8 Mil/uL (ref 3.87–5.11)
RDW: 12.9 % (ref 11.5–15.5)
WBC: 7.9 10*3/uL (ref 4.0–10.5)

## 2023-03-04 LAB — TSH: TSH: 1.52 u[IU]/mL (ref 0.35–5.50)

## 2023-03-04 LAB — LDL CHOLESTEROL, DIRECT: Direct LDL: 100 mg/dL

## 2023-03-04 LAB — HEMOGLOBIN A1C: Hgb A1c MFr Bld: 5 % (ref 4.6–6.5)

## 2023-03-04 NOTE — Assessment & Plan Note (Signed)
Given chronicity of symptoms, development of facial pain and exam consistent with bacterial URI,  Will treat with a second round of empiric antibiotics, decongestants, and saline lavage.  Adding steroid nasal spray if not already taking.

## 2023-03-04 NOTE — Assessment & Plan Note (Signed)
With panic attacks dramaticllally reduced with prn alprazolam prn and daily paxil. No changes today . The risks and benefits of benzodiazepine use were discussed with patient today including excessive sedation leading to respiratory depression,  impaired thinking/driving, and addiction.  Patient was advised to avoid concurrent use with alcohol, to use medication only as needed and not to share with others  .

## 2023-03-05 ENCOUNTER — Encounter: Payer: Self-pay | Admitting: Internal Medicine

## 2023-03-05 NOTE — Telephone Encounter (Signed)
Pharmacy is correct in chart, not sure why it went to [facility] on [address]

## 2023-03-07 MED ORDER — ALPRAZOLAM 0.25 MG PO TABS: 0.2500 mg | ORAL_TABLET | Freq: Every evening | ORAL | 5 refills | Status: AC | PRN

## 2023-03-16 ENCOUNTER — Other Ambulatory Visit: Payer: Self-pay | Admitting: Internal Medicine

## 2023-05-09 ENCOUNTER — Encounter: Payer: Self-pay | Admitting: Internal Medicine

## 2023-05-10 ENCOUNTER — Other Ambulatory Visit: Payer: Self-pay | Admitting: Internal Medicine

## 2023-05-10 ENCOUNTER — Ambulatory Visit: Payer: Self-pay | Admitting: Internal Medicine

## 2023-05-10 DIAGNOSIS — I10 Essential (primary) hypertension: Secondary | ICD-10-CM

## 2023-05-10 MED ORDER — AMLODIPINE BESYLATE 5 MG PO TABS: 5.0000 mg | ORAL_TABLET | Freq: Every day | ORAL | 1 refills | Status: DC

## 2023-05-10 NOTE — Telephone Encounter (Signed)
Copied from CRM [id number]. Topic: Clinical - Medication Question >> May 10, 2023  2:37 PM Sim Boast F wrote: Reason for CRM: Patient would like to discuss the AmLODipine that was sent to her pharmacy today

## 2023-05-10 NOTE — Telephone Encounter (Signed)
Spoke with pt and she wanted to know if she could start back on taking the meloxicam. I advised pt per her mychart message that Dr. Darrick Huntsman does not want her to start back on the meloxicam yet, she would like for her to be on the amlodipine for and week and then start checking bp once daily for two weeks, then send [place] the readings. Pt gave a verbal understanding.

## 2023-05-10 NOTE — Assessment & Plan Note (Signed)
Home readings have been elevated despite suspension on NSAIDS .  She has normal renal function, no proteinuria.  Starting amlodipine 5 mg daily

## 2023-05-17 ENCOUNTER — Encounter: Payer: Self-pay | Admitting: Internal Medicine

## 2023-05-17 ENCOUNTER — Other Ambulatory Visit: Payer: Self-pay | Admitting: Internal Medicine

## 2023-05-17 ENCOUNTER — Ambulatory Visit: Payer: BC Managed Care – PPO

## 2023-05-17 ENCOUNTER — Ambulatory Visit: Payer: BC Managed Care – PPO | Admitting: Internal Medicine

## 2023-05-17 VITALS — BP 122/88 | HR 84 | Ht 66.0 in | Wt 174.4 lb

## 2023-05-17 DIAGNOSIS — E669 Obesity, unspecified: Secondary | ICD-10-CM | POA: Diagnosis not present

## 2023-05-17 DIAGNOSIS — R0789 Other chest pain: Secondary | ICD-10-CM | POA: Insufficient documentation

## 2023-05-17 DIAGNOSIS — I10 Essential (primary) hypertension: Secondary | ICD-10-CM | POA: Diagnosis not present

## 2023-05-17 LAB — COMPREHENSIVE METABOLIC PANEL
ALT: 16 U/L (ref 0–35)
AST: 16 U/L (ref 0–37)
Albumin: 4.2 g/dL (ref 3.5–5.2)
Alkaline Phosphatase: 52 U/L (ref 39–117)
BUN: 19 mg/dL (ref 6–23)
CO2: 28 meq/L (ref 19–32)
Calcium: 9 mg/dL (ref 8.4–10.5)
Chloride: 102 meq/L (ref 96–112)
Creatinine, Ser: 0.69 mg/dL (ref 0.40–1.20)
GFR: 101.65 mL/min (ref >60.00)
Glucose, Bld: 82 mg/dL (ref 70–99)
Potassium: 4.5 meq/L (ref 3.5–5.1)
Sodium: 137 meq/L (ref 135–145)
Total Bilirubin: 0.4 mg/dL (ref 0.2–1.2)
Total Protein: 7.1 g/dL (ref 6.0–8.3)

## 2023-05-17 LAB — TROPONIN I (HIGH SENSITIVITY): High Sens Troponin I: 4 ng/L (ref 2–17)

## 2023-05-17 LAB — LIPASE: Lipase: 23 U/L (ref 11.0–59.0)

## 2023-05-17 LAB — CK: Total CK: 35 U/L (ref 7–177)

## 2023-05-17 MED ORDER — PANTOPRAZOLE SODIUM 40 MG PO TBEC: 40.0000 mg | DELAYED_RELEASE_TABLET | Freq: Every day | ORAL | 3 refills | Status: DC

## 2023-05-17 NOTE — Assessment & Plan Note (Signed)
Weight loss of 41 lbs using wegovy,  currently at 2.4 mg weekly dose

## 2023-05-17 NOTE — Patient Instructions (Addendum)
Your pain is unlikely to be cardiac in nature since it is constant,  but I will review your EKG and chest x ray  The labs will help rule out liver and pancreas issues  (and a heart attack!)  Other potential sources include reflux esophagitis, so please try using pantoprazole for 4 weeks  Continue paxil at current dose   Continue amlodipine at current dose (5 mg ).  Increase the dose to 10 mg in one week if your last 3 readings have been > 130 /80, and let me know

## 2023-05-17 NOTE — Assessment & Plan Note (Addendum)
 Pain is not reproducible with manuevers .   have ordered and reviewed a 12 lead EKG and find that there are no acute changes and patient is in sinus rhythm.   Will treat for GERD,  continue treatment for GAD , screen for choledocholithiasis and pancreatitis, and consider CT chest/thoracic spine if persistent   Lab Results  Component Value Date   CKTOTAL 35 05/17/2023

## 2023-05-17 NOTE — Assessment & Plan Note (Signed)
Home readings  were elevated despite suspension on NSAIDS .  She has normal renal function, no proteinuria.  She is tolerating  amlodipine 5 mg daily .  Will have her increase dose to 10 mg if not 130/80 or less in a few days.

## 2023-05-17 NOTE — Progress Notes (Addendum)
 Subjective:  Patient ID: Christine Monroe, female    DOB: 11-Apr-1976  Age: 48 y.o. MRN: [id number]  CC: The primary encounter diagnosis was Obesity (BMI 30-39.9). Diagnoses of Atypical chest pain and Essential hypertension were also pertinent to this visit.   HPI Christine Monroe presents for  Chief Complaint  Patient presents with   Anxiety   2 week history of chest pressure , first occurrence while lying  supine   BP was checked and elevated,  166 systolic .  Pain is aggravated by twisting of torso but not lifting or lowering of arms.  Feels it is constant  heavy pressure in the chest for the last  2 weeks , .  No nausea,  diaphoresis or shortness of breath . Woke her up last night due to pain , in response has increased her use of alprazolam to twice daily which is helping but only transient     S/p cholecystectomy  S/p endometrial ablation  8 yrs ago amenorrheic since then Taking Wegovy 2.4 mg weekly Taking meloxicam daily  no PPI  Has been taking amlodipine 5 mg daily since she texted her symptoms (Jan 28)    Outpatient Medications Prior to Visit  Medication Sig Dispense Refill   ALPRAZolam (XANAX) 0.25 MG tablet Take 1 tablet (0.25 mg total) by mouth at bedtime as needed for sleep. 30 tablet 5   PARoxetine (PAXIL) 20 MG tablet Take 1 tablet (20 mg total) by mouth daily. 90 tablet 1   valACYclovir (VALTREX) 1000 MG tablet Start Valacyclovir 1 gm take 2 tablets at first sign of flare repeat in 12 hours, or take 1 tablet twice a day for 5 days 30 tablet 11   amLODipine (NORVASC) 5 MG tablet Take 1 tablet (5 mg total) by mouth daily. 30 tablet 1   meloxicam (MOBIC) 15 MG tablet Take 15 mg by mouth daily. (Patient not taking: Reported on 07/02/2023)     Semaglutide-Weight Management (WEGOVY) 2.4 MG/0.75ML SOAJ INJECT 2.4 MG INTO THE SKIN ONCE A WEEK. 3 mL 1   predniSONE (DELTASONE) 10 MG tablet 6 tablets on Day 1 , then reduce by 1 tablet daily until gone (Patient not taking: Reported on  05/17/2023) 21 tablet 0   No facility-administered medications prior to visit.    Review of Systems;  Patient denies headache, fevers, malaise, unintentional weight loss, skin rash, eye pain, sinus congestion and sinus pain, sore throat, dysphagia,  hemoptysis , cough, dyspnea, wheezing,  palpitations, orthopnea, edema, abdominal pain, nausea, melena, diarrhea, constipation, flank pain, dysuria, hematuria, urinary  Frequency, nocturia, numbness, tingling, seizures,  Focal weakness, Loss of consciousness,  Tremor, insomnia, depression, and suicidal ideation.      Objective:  BP 122/88   Pulse 84   Ht 5\' 6"  (1.676 m)   Wt 174 lb 6.4 oz (79.1 kg)   SpO2 99%   BMI 28.15 kg/m   BP Readings from Last 3 Encounters:  07/02/23 (!) 148/100  05/17/23 122/88  03/03/23 130/88    Wt Readings from Last 3 Encounters:  07/02/23 170 lb 12.8 oz (77.5 kg)  05/17/23 174 lb 6.4 oz (79.1 kg)  03/03/23 172 lb 12.8 oz (78.4 kg)    Physical Exam Vitals reviewed.  Constitutional:      General: She is not in acute distress.    Appearance: Normal appearance. She is normal weight. She is not ill-appearing, toxic-appearing or diaphoretic.  HENT:     Head: Normocephalic.  Eyes:  General: No scleral icterus.       Right eye: No discharge.        Left eye: No discharge.     Conjunctiva/sclera: Conjunctivae normal.  Cardiovascular:     Rate and Rhythm: Normal rate and regular rhythm.     Heart sounds: Normal heart sounds.  Pulmonary:     Effort: Pulmonary effort is normal. No respiratory distress.     Breath sounds: Normal breath sounds.  Musculoskeletal:        General: Normal range of motion.  Skin:    General: Skin is warm and dry.  Neurological:     General: No focal deficit present.     Mental Status: She is alert and oriented to person, place, and time. Mental status is at baseline.  Psychiatric:        Mood and Affect: Mood normal.        Behavior: Behavior normal.        Thought  Content: Thought content normal.        Judgment: Judgment normal.    Lab Results  Component Value Date   HGBA1C 5.0 03/03/2023   HGBA1C 5.2 06/11/2020    Lab Results  Component Value Date   CREATININE 0.69 05/17/2023   CREATININE 0.71 03/03/2023   CREATININE 0.78 06/11/2020    Lab Results  Component Value Date   WBC 7.9 03/03/2023   HGB 14.5 03/03/2023   HCT 43.0 03/03/2023   PLT 259.0 03/03/2023   GLUCOSE 82 05/17/2023   CHOL 175 03/03/2023   TRIG 188.0 (H) 03/03/2023   HDL 52.00 03/03/2023   LDLDIRECT 100.0 03/03/2023   LDLCALC 86 03/03/2023   ALT 16 05/17/2023   AST 16 05/17/2023   NA 137 05/17/2023   K 4.5 05/17/2023   CL 102 05/17/2023   CREATININE 0.69 05/17/2023   BUN 19 05/17/2023   CO2 28 05/17/2023   TSH 1.52 03/03/2023   HGBA1C 5.0 03/03/2023   MICROALBUR 0.9 03/03/2023    No results found.  Assessment & Plan:  .Obesity (BMI 30-39.9) Assessment & Plan: Weight loss of 41 lbs using wegovy,  currently at 2.4 mg weekly dose    Atypical chest pain Assessment & Plan:  Pain is not reproducible with manuevers .   have ordered and reviewed a 12 lead EKG and find that there are no acute changes and patient is in sinus rhythm.   Will treat for GERD,  continue treatment for GAD , screen for choledocholithiasis and pancreatitis, and consider CT chest/thoracic spine if persistent   Lab Results  Component Value Date   CKTOTAL 35 05/17/2023      Orders: -     EKG 12-Lead -     Lipase -     Comprehensive metabolic panel -     Troponin I (High Sensitivity); Future -     CK -     DG Chest 2 View; Future -     Troponin I (High Sensitivity)  Essential hypertension Assessment & Plan: Home readings  were elevated despite suspension on NSAIDS .  She has normal renal function, no proteinuria.  She is tolerating  amlodipine 5 mg daily .  Will have her increase dose to 10 mg if not 130/80 or less in a few days.   Other orders -     Pantoprazole Sodium;  Take 1 tablet (40 mg total) by mouth daily.  Dispense: 30 tablet; Refill: 3     IIn addition to ordering and reading  an EKG,  I  provided 30 minutes of face-to-face time during this encounter reviewing patient's last visit with me, patient's   previous  surgical and non surgical procedures, previous  labs and imaging studies, counseling on currently addressed issues,  and post visit ordering to diagnostics and therapeutics .   Follow-up: Return in about 4 weeks (around 06/14/2023) for chest pain .   Sherlene Shams, MD

## 2023-05-18 ENCOUNTER — Encounter: Payer: Self-pay | Admitting: Internal Medicine

## 2023-05-19 DIAGNOSIS — M170 Bilateral primary osteoarthritis of knee: Secondary | ICD-10-CM | POA: Diagnosis not present

## 2023-05-25 ENCOUNTER — Other Ambulatory Visit: Payer: Self-pay | Admitting: Internal Medicine

## 2023-05-26 ENCOUNTER — Encounter: Payer: Self-pay | Admitting: Internal Medicine

## 2023-06-01 ENCOUNTER — Other Ambulatory Visit: Payer: Self-pay | Admitting: Internal Medicine

## 2023-06-07 DIAGNOSIS — M170 Bilateral primary osteoarthritis of knee: Secondary | ICD-10-CM | POA: Diagnosis not present

## 2023-06-14 DIAGNOSIS — M170 Bilateral primary osteoarthritis of knee: Secondary | ICD-10-CM | POA: Diagnosis not present

## 2023-06-15 ENCOUNTER — Ambulatory Visit: Payer: BC Managed Care – PPO | Admitting: Internal Medicine

## 2023-06-21 DIAGNOSIS — M170 Bilateral primary osteoarthritis of knee: Secondary | ICD-10-CM | POA: Diagnosis not present

## 2023-07-02 ENCOUNTER — Ambulatory Visit: Admitting: Internal Medicine

## 2023-07-02 ENCOUNTER — Encounter: Payer: Self-pay | Admitting: Internal Medicine

## 2023-07-02 VITALS — BP 148/100 | HR 80 | Ht 66.0 in | Wt 170.8 lb

## 2023-07-02 DIAGNOSIS — G8929 Other chronic pain: Secondary | ICD-10-CM

## 2023-07-02 DIAGNOSIS — K219 Gastro-esophageal reflux disease without esophagitis: Secondary | ICD-10-CM | POA: Diagnosis not present

## 2023-07-02 DIAGNOSIS — I10 Essential (primary) hypertension: Secondary | ICD-10-CM | POA: Diagnosis not present

## 2023-07-02 DIAGNOSIS — R0789 Other chest pain: Secondary | ICD-10-CM

## 2023-07-02 DIAGNOSIS — M25561 Pain in right knee: Secondary | ICD-10-CM | POA: Diagnosis not present

## 2023-07-02 DIAGNOSIS — R079 Chest pain, unspecified: Secondary | ICD-10-CM

## 2023-07-02 MED ORDER — HYDROCHLOROTHIAZIDE 25 MG PO TABS: 25.0000 mg | ORAL_TABLET | Freq: Every day | ORAL | 3 refills | Status: DC

## 2023-07-02 MED ORDER — WEGOVY 2.4 MG/0.75ML ~~LOC~~ SOAJ: 2.4000 mg | SUBCUTANEOUS | 11 refills | Status: DC

## 2023-07-02 NOTE — Progress Notes (Signed)
 Subjective:  Patient ID: Christine Monroe, female    DOB: 06/24/1975  Age: 48 y.o. MRN: [id number]  CC: The primary encounter diagnosis was Essential hypertension. Diagnoses of Atypical chest pain, Chronic pain of right knee, and Chest pain due to GERD, without esophagitis were also pertinent to this visit.   HPI Christine Monroe presents for  Chief Complaint  Patient presents with   Medical Management of Chronic Issues    1 month follow up on chest pains    Christine Monroe  has a history of hypertension, obesity and anxiety and was seen one month ago with a 2 week history of atypical persistent non exertional chest pain.  Chest x ray, Cardiac enzymes , EKG, lipase were all normal.  She was prescribed a PPI for GERD to take daily .  NSAID was stopped,  and  she returns today for follow up   1) chest pain : symptoms have renarly stopped since starting PPI  2) HTN:  taking amlodipine 5 mg daily .  Has a headache for the past 2 days.  Has not checked lately   3) obesity:  using Wegovy,  4 lbs down since last month  45 lbs since jan 2023   4) Knee pain :  stopped meloxicam  had fel injections last week  Outpatient Medications Prior to Visit  Medication Sig Dispense Refill   ALPRAZolam (XANAX) 0.25 MG tablet Take 1 tablet (0.25 mg total) by mouth at bedtime as needed for sleep. 30 tablet 5   amLODipine (NORVASC) 5 MG tablet TAKE 1 TABLET (5 MG TOTAL) BY MOUTH DAILY. 90 tablet 1   methocarbamol (ROBAXIN) 500 MG tablet Take 500 mg by mouth 2 (two) times daily as needed.     pantoprazole (PROTONIX) 40 MG tablet Take 1 tablet (40 mg total) by mouth daily. 30 tablet 3   PARoxetine (PAXIL) 20 MG tablet Take 1 tablet (20 mg total) by mouth daily. 90 tablet 1   valACYclovir (VALTREX) 1000 MG tablet Start Valacyclovir 1 gm take 2 tablets at first sign of flare repeat in 12 hours, or take 1 tablet twice a day for 5 days 30 tablet 11   Semaglutide-Weight Management (WEGOVY) 2.4 MG/0.75ML SOAJ INJECT 2.4 MG INTO THE  SKIN ONCE A WEEK. 3 mL 1   meloxicam (MOBIC) 15 MG tablet Take 15 mg by mouth daily. (Patient not taking: Reported on 07/02/2023)     No facility-administered medications prior to visit.    Review of Systems;  Patient denies headache, fevers, malaise, unintentional weight loss, skin rash, eye pain, sinus congestion and sinus pain, sore throat, dysphagia,  hemoptysis , cough, dyspnea, wheezing,  palpitations, orthopnea, edema, abdominal pain, nausea, melena, diarrhea, constipation, flank pain, dysuria, hematuria, urinary  Frequency, nocturia, numbness, tingling, seizures,  Focal weakness, Loss of consciousness,  Tremor, insomnia, depression, anxiety, and suicidal ideation.      Objective:  BP (!) 148/100   Pulse 80   Ht 5\' 6"  (1.676 m)   Wt 170 lb 12.8 oz (77.5 kg)   SpO2 99%   BMI 27.57 kg/m   BP Readings from Last 3 Encounters:  07/02/23 (!) 148/100  05/17/23 122/88  03/03/23 130/88    Wt Readings from Last 3 Encounters:  07/02/23 170 lb 12.8 oz (77.5 kg)  05/17/23 174 lb 6.4 oz (79.1 kg)  03/03/23 172 lb 12.8 oz (78.4 kg)    Physical Exam Vitals reviewed.  Constitutional:      General: She is not in  acute distress.    Appearance: Normal appearance. She is normal weight. She is not ill-appearing, toxic-appearing or diaphoretic.  HENT:     Head: Normocephalic.  Eyes:     General: No scleral icterus.       Right eye: No discharge.        Left eye: No discharge.     Conjunctiva/sclera: Conjunctivae normal.  Cardiovascular:     Rate and Rhythm: Normal rate and regular rhythm.     Heart sounds: Normal heart sounds.  Pulmonary:     Effort: Pulmonary effort is normal. No respiratory distress.     Breath sounds: Normal breath sounds.  Musculoskeletal:        General: Normal range of motion.  Skin:    General: Skin is warm and dry.  Neurological:     General: No focal deficit present.     Mental Status: She is alert and oriented to person, place, and time. Mental  status is at baseline.  Psychiatric:        Mood and Affect: Mood normal.        Behavior: Behavior normal.        Thought Content: Thought content normal.        Judgment: Judgment normal.    Lab Results  Component Value Date   HGBA1C 5.0 03/03/2023   HGBA1C 5.2 06/11/2020    Lab Results  Component Value Date   CREATININE 0.69 05/17/2023   CREATININE 0.71 03/03/2023   CREATININE 0.78 06/11/2020    Lab Results  Component Value Date   WBC 7.9 03/03/2023   HGB 14.5 03/03/2023   HCT 43.0 03/03/2023   PLT 259.0 03/03/2023   GLUCOSE 82 05/17/2023   CHOL 175 03/03/2023   TRIG 188.0 (H) 03/03/2023   HDL 52.00 03/03/2023   LDLDIRECT 100.0 03/03/2023   LDLCALC 86 03/03/2023   ALT 16 05/17/2023   AST 16 05/17/2023   NA 137 05/17/2023   K 4.5 05/17/2023   CL 102 05/17/2023   CREATININE 0.69 05/17/2023   BUN 19 05/17/2023   CO2 28 05/17/2023   TSH 1.52 03/03/2023   HGBA1C 5.0 03/03/2023   MICROALBUR 0.9 03/03/2023    No results found.  Assessment & Plan:  .Essential hypertension Assessment & Plan: CORRECTED UAcR IS 18.  CONTINUE 5 MG AMLODIPINE,  ADDING HCTZ  Orders: -     Basic metabolic panel; Future  Atypical chest pain Assessment & Plan: Resolved with D/c meloxicam and start of PPI.     Chronic pain of right knee Assessment & Plan: Had her 3rd gel injectio last week. Trial of diclofenac /tylenol.  Add tramadol if pain is not controlled.  NO ORAL NSAIDS.    Chest pain due to GERD, without esophagitis Assessment & Plan: Improved symptoms with protonix 40 mg ,  continue medication   Other orders -     ZOXWRU; Inject 2.4 mg into the skin once a week.  Dispense: 3 mL; Refill: 11 -     hydroCHLOROthiazide; Take 1 tablet (25 mg total) by mouth daily.  Dispense: 90 tablet; Refill: 3    Follow-up: Return in about 3 months (around 10/02/2023) for hypertension.   Sherlene Shams, MD

## 2023-07-02 NOTE — Assessment & Plan Note (Signed)
 Had her 3rd gel injectio last week. Trial of diclofenac /tylenol.  Add tramadol if pain is not controlled.  NO ORAL NSAIDS.

## 2023-07-02 NOTE — Assessment & Plan Note (Signed)
 CORRECTED UAcR IS 18.  CONTINUE 5 MG AMLODIPINE,  ADDING HCTZ

## 2023-07-02 NOTE — Assessment & Plan Note (Addendum)
 Improved symptoms with protonix 40 mg ,  continue medication

## 2023-07-02 NOTE — Patient Instructions (Addendum)
 1) Glad you are feeling better.  WE can assume the chest pain was from GERD/esophagitis.  Staying off the meloxicam is the ideal plan   2) For your knee pain:  Max out  your tylenol dose:  you can take 1000 mg every 8 hours   if still painful  let me know and I will call in tramadol  to ADD (controlled substance so I am limited in the quantity to #28 for the first rx)   You can also Try voltaren gel on   the knee for pain relief .  It's a topical NSAID that will not affect your stomach at normal doses.   Stay on the pantoprazole    3)For the sinuses:  Consider getting back on daily flonase (steroid spray)  alternating with astelin  (antihistamine spray)  :  both are available OTC   4) For your blood pressure:  Adding hydrochlorothiazide 25 mg daily fr BP .  Continue 5 mg amlodipine AS WELL   START CHECKING YOUR  BP in one week and send me a few readings via mychart

## 2023-07-02 NOTE — Assessment & Plan Note (Signed)
 Resolved with D/c meloxicam and start of PPI.

## 2023-07-05 ENCOUNTER — Ambulatory Visit: Payer: BC Managed Care – PPO | Admitting: Internal Medicine

## 2023-07-20 ENCOUNTER — Telehealth: Payer: Self-pay

## 2023-07-20 MED ORDER — DOXYCYCLINE HYCLATE 100 MG PO TABS: ORAL_TABLET | ORAL | 1 refills | Status: DC

## 2023-07-20 NOTE — Telephone Encounter (Signed)
 Patient called requesting a refill of Doxycyline, her acne is starting to flare and her son is getting married next month.   Ok Doxycyline 100 mg #30 1 RF sent to [facility] in [place]

## 2023-08-17 ENCOUNTER — Ambulatory Visit: Admitting: Dermatology

## 2023-08-17 ENCOUNTER — Encounter: Payer: Self-pay | Admitting: Dermatology

## 2023-08-17 DIAGNOSIS — Z86018 Personal history of other benign neoplasm: Secondary | ICD-10-CM

## 2023-08-17 DIAGNOSIS — D492 Neoplasm of unspecified behavior of bone, soft tissue, and skin: Secondary | ICD-10-CM | POA: Diagnosis not present

## 2023-08-17 DIAGNOSIS — D0461 Carcinoma in situ of skin of right upper limb, including shoulder: Secondary | ICD-10-CM | POA: Diagnosis not present

## 2023-08-17 DIAGNOSIS — D485 Neoplasm of uncertain behavior of skin: Secondary | ICD-10-CM

## 2023-08-17 DIAGNOSIS — D099 Carcinoma in situ, unspecified: Secondary | ICD-10-CM

## 2023-08-17 HISTORY — DX: Carcinoma in situ, unspecified: D09.9

## 2023-08-17 NOTE — Patient Instructions (Addendum)

## 2023-08-17 NOTE — Progress Notes (Signed)
   Follow-Up Visit   Subjective  Christine Monroe is a 48 y.o. female who presents for the following: Irregular skin lesion x 1 year on the R forearm that goes away, but then comes back. Pt is concerned and would like it checked today. Hx dysplastic nevi.  The patient has spots, moles and lesions to be evaluated, some may be new or changing and the patient may have concern these could be cancer.  The following portions of the chart were reviewed this encounter and updated as appropriate: medications, allergies, medical history  Review of Systems:  No other skin or systemic complaints except as noted in HPI or Assessment and Plan.  Objective  Well appearing patient in no apparent distress; mood and affect are within normal limits.   A focused examination was performed of the following areas: the face and arms   Relevant exam findings are noted in the Assessment and Plan.  R forearm 5 mm pink scar like papule with overlying brown scale   Assessment & Plan    NEOPLASM OF UNCERTAIN BEHAVIOR OF SKIN R forearm Skin / nail biopsy Type of biopsy: tangential   Informed consent: discussed and consent obtained   Timeout: patient name, date of birth, surgical site, and procedure verified   Procedure prep:  Patient was prepped and draped in usual sterile fashion Prep type:  Isopropyl alcohol Anesthesia: the lesion was anesthetized in a standard fashion   Anesthetic:  1% lidocaine  w/ epinephrine 1-100,000 buffered w/ 8.4% NaHCO3 Instrument used: DermaBlade   Hemostasis achieved with: pressure and aluminum chloride   Outcome: patient tolerated procedure well   Post-procedure details: sterile dressing applied and wound care instructions given   Dressing type: bandage and petrolatum   Specimen 1 - Surgical pathology Differential Diagnosis: AK vs ISK vs SCC Check Margins: No  Return for appointment as scheduled.  Arlinda Lais, CMA, am acting as scribe for Harris Liming, MD  .  Documentation: I have reviewed the above documentation for accuracy and completeness, and I agree with the above.  Harris Liming, MD

## 2023-08-24 LAB — SURGICAL PATHOLOGY

## 2023-08-25 ENCOUNTER — Ambulatory Visit: Payer: Self-pay | Admitting: Dermatology

## 2023-08-25 MED ORDER — FLUOROURACIL 5 % EX CREA: TOPICAL_CREAM | CUTANEOUS | 2 refills | Status: DC

## 2023-08-25 NOTE — Telephone Encounter (Signed)
 Discussed pathology results and treatment options. Patient prefers 5FU/calcipotriene cream. Sent to [facility]. Recheck scheduled for November 02, 2023

## 2023-08-25 NOTE — Telephone Encounter (Signed)
-----   Message from [place] sent at 08/25/2023 11:17 AM EDT ----- Diagnosis R forearm :       SQUAMOUS CELL CARCINOMA IN SITU ARISING IN LICHENOID ACTINIC KERATOSIS     Please call with diagnosis and message me with patient's decision on treatment.   Explanation: Biopsy shows a squamous cell skin cancer limited to the top layer of skin. This means it is an early cancer and has not spread. However, it has the potential to spread beyond the skin and threaten your health, so we recommend treating it.   Treatment option 1: a cream (fluorouracil and calcipotriene) that helps your immune system clear the skin cancer. It will cause redness and irritation. Wait two weeks after the biopsy to start applying the cream. Apply the cream twice per day until the redness and irritation develop (usually occurs by day 7), then stop and allow it to heal. We will recheck the area in 2 months to ensure the cancer is gone. The cream is $45 plus shipping and will be mailed to you from a low cost compounding pharmacy.  Treatment option 2: you return for a brief appointment where I perform electrodesiccation and curettage [facility]). This involves three rounds of scraping and burning to destroy the skin cancer. It has about an 85% cure rate and leaves a round wound slightly larger than the skin cancer and leaves a round white scar. No additional pathology is done. If the skin cancer comes back, we would need to do a surgery to remove it.

## 2023-08-25 NOTE — Addendum Note (Signed)
 Addended by: Lacoya Wilbanks on: 08/25/2023 02:22 PM   Modules accepted: Orders

## 2023-09-01 ENCOUNTER — Encounter: Payer: Self-pay | Admitting: Internal Medicine

## 2023-09-02 MED ORDER — PREDNISONE 10 MG PO TABS: ORAL_TABLET | ORAL | 0 refills | Status: DC

## 2023-09-02 NOTE — Addendum Note (Signed)
 Addended by: Thersia Flax on: 09/02/2023 10:20 AM   Modules accepted: Orders

## 2023-09-18 ENCOUNTER — Other Ambulatory Visit: Payer: Self-pay | Admitting: Internal Medicine

## 2023-10-05 ENCOUNTER — Ambulatory Visit

## 2023-10-05 ENCOUNTER — Encounter: Payer: Self-pay | Admitting: Internal Medicine

## 2023-10-05 ENCOUNTER — Ambulatory Visit: Admitting: Internal Medicine

## 2023-10-05 VITALS — BP 128/76 | HR 79 | Ht 66.0 in | Wt 174.2 lb

## 2023-10-05 DIAGNOSIS — E669 Obesity, unspecified: Secondary | ICD-10-CM | POA: Diagnosis not present

## 2023-10-05 DIAGNOSIS — R2231 Localized swelling, mass and lump, right upper limb: Secondary | ICD-10-CM | POA: Insufficient documentation

## 2023-10-05 DIAGNOSIS — R748 Abnormal levels of other serum enzymes: Secondary | ICD-10-CM

## 2023-10-05 DIAGNOSIS — R160 Hepatomegaly, not elsewhere classified: Secondary | ICD-10-CM

## 2023-10-05 DIAGNOSIS — M79622 Pain in left upper arm: Secondary | ICD-10-CM | POA: Diagnosis not present

## 2023-10-05 DIAGNOSIS — I10 Essential (primary) hypertension: Secondary | ICD-10-CM

## 2023-10-05 DIAGNOSIS — M19012 Primary osteoarthritis, left shoulder: Secondary | ICD-10-CM | POA: Diagnosis not present

## 2023-10-05 DIAGNOSIS — M25512 Pain in left shoulder: Secondary | ICD-10-CM | POA: Diagnosis not present

## 2023-10-05 DIAGNOSIS — G8929 Other chronic pain: Secondary | ICD-10-CM | POA: Diagnosis not present

## 2023-10-05 DIAGNOSIS — F411 Generalized anxiety disorder: Secondary | ICD-10-CM

## 2023-10-05 MED ORDER — PAROXETINE HCL 20 MG PO TABS: 20.0000 mg | ORAL_TABLET | Freq: Every day | ORAL | 1 refills | Status: DC

## 2023-10-05 NOTE — Assessment & Plan Note (Signed)
 She has achieved a weight loss of 41 lbs using wegovy ,  currently at 2.4 mg weekly dose

## 2023-10-05 NOTE — Assessment & Plan Note (Signed)
With panic attacks dramaticllally reduced with prn alprazolam prn and daily paxil. No changes today . The risks and benefits of benzodiazepine use were discussed with patient today including excessive sedation leading to respiratory depression,  impaired thinking/driving, and addiction.  Patient was advised to avoid concurrent use with alcohol, to use medication only as needed and not to share with others  .

## 2023-10-05 NOTE — Patient Instructions (Addendum)
 OK TO  USE FAMOTIDINE 20 MG DIALY INSTEAD OF PANTOPRAZOLE .  AND OK TO ALTERNATIVE BETWEEN THE 2    [email or web address]  IS DR SHAH'S  WEBSITE FOR BEING PEOPLE WHO WANT TO BE PROACTIVE ABOUT PREVENTING DEMENTIA

## 2023-10-05 NOTE — Assessment & Plan Note (Signed)
 CORRECTED UAcR IS 18.  CONTINUE 5 MG AMLODIPINE , HCTZ

## 2023-10-05 NOTE — Assessment & Plan Note (Signed)
 Asymptomatic thus far , no trigger finger

## 2023-10-05 NOTE — Progress Notes (Signed)
 Subjective:  Patient ID: Christine Monroe, female    DOB: 1975/07/17  Age: 48 y.o. MRN: [id number]  CC: The primary encounter diagnosis was Elevated liver enzymes. Diagnoses of Essential hypertension, Chronic left shoulder pain, Obesity (BMI 30-39.9), Generalized anxiety disorder, and Palmar nodule, right were also pertinent to this visit.   HPI Christine Monroe presents for  Chief Complaint  Patient presents with   Medical Management of Chronic Issues    3 month follow up    1) HTN:  Hypertension: patient checks blood pressure twice weekly at home.  Readings have been for the most part <130/80 at rest . Patient is following a reduced salt diet most days and is taking medications as prescribed   2) overweight: taking Wegovy  2.4 mg weekly . Exercise activities have been limited to walking   3) knee pain  managed with icing and tylenol. Sees [facility] for severe DJD.  Gel injections helping   4) insomnia managed with prn alprazolam    5) Patient is taking her medications as prescribed and notes no adverse effects.  Home BP readings have been done about once per week and are  generally < 130/80 .  She is avoiding added salt in her diet and walking regularly about 3 times per week for exercise    6) asymptomati nodule on palmar surface of had at base of 3rd metacarpal   Outpatient Medications Prior to Visit  Medication Sig Dispense Refill   ALPRAZolam  (XANAX ) 0.25 MG tablet Take 1 tablet (0.25 mg total) by mouth at bedtime as needed for sleep. 30 tablet 5   amLODipine  (NORVASC ) 5 MG tablet TAKE 1 TABLET (5 MG TOTAL) BY MOUTH DAILY. 90 tablet 1   hydrochlorothiazide  (HYDRODIURIL ) 25 MG tablet Take 1 tablet (25 mg total) by mouth daily. 90 tablet 3   methocarbamol (ROBAXIN) 500 MG tablet Take 500 mg by mouth 2 (two) times daily as needed.     pantoprazole  (PROTONIX ) 40 MG tablet TAKE 1 TABLET BY MOUTH EVERY DAY 30 tablet 3   Semaglutide -Weight Management (WEGOVY ) 2.4 MG/0.75ML SOAJ Inject 2.4 mg  into the skin once a week. 3 mL 11   valACYclovir  (VALTREX ) 1000 MG tablet Start Valacyclovir  1 gm take 2 tablets at first sign of flare repeat in 12 hours, or take 1 tablet twice a day for 5 days 30 tablet 11   fluorouracil  (EFUDEX ) 5 % cream Wait two weeks after the biopsy before starting the cream. Apply the cream twice per day to the area where the skin cancer was and a quarter inch around that area. Apply until the redness and irritation develop (usually occurs by day 7), then stop and allow it to heal. Protect the area from sunlight while it is healing with SPF30+ sunscreen. 30 g 2   PARoxetine  (PAXIL ) 20 MG tablet Take 1 tablet (20 mg total) by mouth daily. 90 tablet 1   predniSONE  (DELTASONE ) 10 MG tablet 6 tablets on Day 1 , then reduce by 1 tablet daily until gone 21 tablet 0   doxycycline  (VIBRA -TABS) 100 MG tablet Take 1 tablet once a day with food (Patient not taking: Reported on 10/05/2023) 30 tablet 1   No facility-administered medications prior to visit.    Review of Systems;  Patient denies headache, fevers, malaise, unintentional weight loss, skin rash, eye pain, sinus congestion and sinus pain, sore throat, dysphagia,  hemoptysis , cough, dyspnea, wheezing, chest pain, palpitations, orthopnea, edema, abdominal pain, nausea, melena, diarrhea, constipation, flank pain, dysuria,  hematuria, urinary  Frequency, nocturia, numbness, tingling, seizures,  Focal weakness, Loss of consciousness,  Tremor, insomnia, depression, anxiety, and suicidal ideation.      Objective:  BP 128/76   Pulse 79   Ht 5' 6 (1.676 m)   Wt 174 lb 3.2 oz (79 kg)   SpO2 98%   BMI 28.12 kg/m   BP Readings from Last 3 Encounters:  10/05/23 128/76  07/02/23 (!) 148/100  05/17/23 122/88    Wt Readings from Last 3 Encounters:  10/05/23 174 lb 3.2 oz (79 kg)  07/02/23 170 lb 12.8 oz (77.5 kg)  05/17/23 174 lb 6.4 oz (79.1 kg)    Physical Exam Vitals reviewed.  Constitutional:      General: She is  not in acute distress.    Appearance: Normal appearance. She is normal weight. She is not ill-appearing, toxic-appearing or diaphoretic.  HENT:     Head: Normocephalic.   Eyes:     General: No scleral icterus.       Right eye: No discharge.        Left eye: No discharge.     Conjunctiva/sclera: Conjunctivae normal.    Cardiovascular:     Rate and Rhythm: Normal rate and regular rhythm.     Heart sounds: Normal heart sounds.  Pulmonary:     Effort: Pulmonary effort is normal. No respiratory distress.     Breath sounds: Normal breath sounds.   Musculoskeletal:        General: Normal range of motion.   Skin:    General: Skin is warm and dry.   Neurological:     General: No focal deficit present.     Mental Status: She is alert and oriented to person, place, and time. Mental status is at baseline.   Psychiatric:        Mood and Affect: Mood normal.        Behavior: Behavior normal.        Thought Content: Thought content normal.        Judgment: Judgment normal.   Lab Results  Component Value Date   HGBA1C 5.0 03/03/2023   HGBA1C 5.2 06/11/2020    Lab Results  Component Value Date   CREATININE 0.69 05/17/2023   CREATININE 0.71 03/03/2023   CREATININE 0.78 06/11/2020    Lab Results  Component Value Date   WBC 7.9 03/03/2023   HGB 14.5 03/03/2023   HCT 43.0 03/03/2023   PLT 259.0 03/03/2023   GLUCOSE 82 05/17/2023   CHOL 175 03/03/2023   TRIG 188.0 (H) 03/03/2023   HDL 52.00 03/03/2023   LDLDIRECT 100.0 03/03/2023   LDLCALC 86 03/03/2023   ALT 16 05/17/2023   AST 16 05/17/2023   NA 137 05/17/2023   K 4.5 05/17/2023   CL 102 05/17/2023   CREATININE 0.69 05/17/2023   BUN 19 05/17/2023   CO2 28 05/17/2023   TSH 1.52 03/03/2023   HGBA1C 5.0 03/03/2023   MICROALBUR 0.9 03/03/2023    No results found.  Assessment & Plan:  .Elevated liver enzymes -     Comprehensive metabolic panel with GFR; Future  Essential hypertension Assessment &  Plan: CORRECTED UAcR IS 18.  CONTINUE 5 MG AMLODIPINE , HCTZ  Orders: -     Comprehensive metabolic panel with GFR; Future -     Lipid Panel w/reflex Direct LDL; Future  Chronic left shoulder pain -     DG Shoulder Left; Future  Obesity (BMI 30-39.9) Assessment & Plan: She has achieved a  weight loss of 41 lbs using wegovy ,  currently at 2.4 mg weekly dose    Generalized anxiety disorder Assessment & Plan: With panic attacks dramaticllally reduced with prn alprazolam  prn and daily paxil . No changes today . The risks and benefits of benzodiazepine use were discussed with patient today including excessive sedation leading to respiratory depression,  impaired thinking/driving, and addiction.  Patient was advised to avoid concurrent use with alcohol, to use medication only as needed and not to share with others  .    Palmar nodule, right Assessment & Plan: Asymptomatic thus far , no trigger finger    Other orders -     PARoxetine  HCl; Take 1 tablet (20 mg total) by mouth daily.  Dispense: 90 tablet; Refill: 1     I spent 34 minutes on the day of this face to face encounter reviewing patient's  m recent  labs and imaging studies, counseling on weight management,  reviewing the assessment and plan with patient, and post visit ordering and reviewing of  diagnostics and therapeutics with patient  .   Follow-up: Return in about 6 months (around 04/05/2024).   Verneita LITTIE Kettering, MD

## 2023-10-12 ENCOUNTER — Ambulatory Visit: Payer: Self-pay | Admitting: Internal Medicine

## 2023-10-12 DIAGNOSIS — R748 Abnormal levels of other serum enzymes: Secondary | ICD-10-CM

## 2023-11-02 ENCOUNTER — Telehealth: Payer: Self-pay

## 2023-11-02 ENCOUNTER — Ambulatory Visit: Admitting: Dermatology

## 2023-11-02 NOTE — Telephone Encounter (Signed)
 PA needed for [facility].

## 2023-11-03 ENCOUNTER — Other Ambulatory Visit (HOSPITAL_COMMUNITY): Payer: Self-pay

## 2023-11-03 ENCOUNTER — Telehealth: Payer: Self-pay

## 2023-11-03 NOTE — Telephone Encounter (Signed)
 Pharmacy Patient Advocate Encounter   Received notification from CoverMyMeds that prior authorization for Wegovy  2.4MG /0.75ML auto-injectors is required/requested.   Insurance verification completed.   The patient is insured through [facility] .   Per test claim: PA required; PA submitted to above mentioned insurance via CoverMyMeds Key/confirmation #/EOC [id number] Status is pending

## 2023-11-04 NOTE — Telephone Encounter (Signed)
 Pharmacy Patient Advocate Encounter  Received notification from [facility] that Prior Authorization for Wegovy  2.4MG /0.75ML auto-injectors has been DENIED.  Full denial letter will be uploaded to the media tab. See denial reason below.  Message from plan: Denied. This health benefit plan does not cover the following services, supplies, drugs or charges: Any treatment or regimen, medical or surgical, for the purpose of reducing or controlling the weight of the member, or for the treatment of obesity, except for surgical treatment of morbid obesity, or as specifically covered by this health benefit plan.  PA #/Case ID/Reference #: [id number]

## 2023-11-04 NOTE — Telephone Encounter (Signed)
 Pt notified by Mychart that Pa was denied.

## 2023-11-05 NOTE — Telephone Encounter (Signed)
 PA request has been Denied. New Encounter has been or will be created for follow up. For additional info see Pharmacy Prior Auth telephone encounter from 11/03/23.

## 2023-11-09 ENCOUNTER — Encounter: Payer: Self-pay | Admitting: Dermatology

## 2023-11-09 ENCOUNTER — Ambulatory Visit: Admitting: Dermatology

## 2023-11-09 DIAGNOSIS — Z86007 Personal history of in-situ neoplasm of skin: Secondary | ICD-10-CM | POA: Diagnosis not present

## 2023-11-09 DIAGNOSIS — Z09 Encounter for follow-up examination after completed treatment for conditions other than malignant neoplasm: Secondary | ICD-10-CM

## 2023-11-09 NOTE — Progress Notes (Signed)
   Follow-Up Visit   Subjective  Christine Monroe is a 48 y.o. female who presents for the following: Recheck SCCIS - R forearm, tx with 5FU/Calcipotriene mix BID x 7 days in the beginning of June. Pt states she had a mild reaction to medication.   The following portions of the chart were reviewed this encounter and updated as appropriate: medications, allergies, medical history  Review of Systems:  No other skin or systemic complaints except as noted in HPI or Assessment and Plan.  Objective  Well appearing patient in no apparent distress; mood and affect are within normal limits.  A focused examination was performed of the following areas: the arms   Relevant exam findings are noted in the Assessment and Plan.    Assessment & Plan   HISTORY OF SQUAMOUS CELL CARCINOMA IN SITU OF THE SKIN - R forearm - No evidence of recurrence today - Recommend regular full body skin exams - Recommend daily broad spectrum sunscreen SPF 30+ to sun-exposed areas, reapply every 2 hours as needed.  - Call if any new or changing lesions are noted between office visits HISTORY OF SQUAMOUS CELL CARCINOMA IN SITU (SCCIS)    No follow-ups on file.  LILLETTE Rosina Mayans, CMA, am acting as scribe for Boneta Sharps, MD .   Documentation: I have reviewed the above documentation for accuracy and completeness, and I agree with the above.  Boneta Sharps, MD

## 2023-11-09 NOTE — Patient Instructions (Signed)

## 2023-11-20 ENCOUNTER — Other Ambulatory Visit: Payer: Self-pay | Admitting: Internal Medicine

## 2023-11-22 ENCOUNTER — Other Ambulatory Visit (INDEPENDENT_AMBULATORY_CARE_PROVIDER_SITE_OTHER)

## 2023-11-22 DIAGNOSIS — I10 Essential (primary) hypertension: Secondary | ICD-10-CM

## 2023-11-22 DIAGNOSIS — R748 Abnormal levels of other serum enzymes: Secondary | ICD-10-CM | POA: Diagnosis not present

## 2023-11-22 LAB — LIPID PANEL W/REFLEX DIRECT LDL
Cholesterol: 215 mg/dL — ABNORMAL HIGH
HDL: 65 mg/dL
LDL Cholesterol (Calc): 123 mg/dL — ABNORMAL HIGH
Non-HDL Cholesterol (Calc): 150 mg/dL — ABNORMAL HIGH
Total CHOL/HDL Ratio: 3.3 (calc)
Triglycerides: 157 mg/dL — ABNORMAL HIGH

## 2023-11-22 LAB — COMPREHENSIVE METABOLIC PANEL WITH GFR
ALT: 51 U/L — ABNORMAL HIGH (ref 0–35)
AST: 28 U/L (ref 0–37)
Albumin: 4.2 g/dL (ref 3.5–5.2)
Alkaline Phosphatase: 86 U/L (ref 39–117)
BUN: 12 mg/dL (ref 6–23)
CO2: 32 meq/L (ref 19–32)
Calcium: 9.1 mg/dL (ref 8.4–10.5)
Chloride: 100 meq/L (ref 96–112)
Creatinine, Ser: 0.84 mg/dL (ref 0.40–1.20)
GFR: 81.1 mL/min
Glucose, Bld: 87 mg/dL (ref 70–99)
Potassium: 3.7 meq/L (ref 3.5–5.1)
Sodium: 139 meq/L (ref 135–145)
Total Bilirubin: 0.4 mg/dL (ref 0.2–1.2)
Total Protein: 6.9 g/dL (ref 6.0–8.3)

## 2023-11-24 NOTE — Addendum Note (Signed)
 Addended by: MARYLYNN VERNEITA CROME on: 11/24/2023 07:40 AM   Modules accepted: Orders

## 2023-11-24 NOTE — Addendum Note (Signed)
 Addended by: MARYLYNN VERNEITA CROME on: 11/24/2023 01:47 PM   Modules accepted: Orders

## 2023-11-24 NOTE — Assessment & Plan Note (Signed)
 Recurrent ALT elevation.  Workup needed

## 2023-11-27 ENCOUNTER — Other Ambulatory Visit: Payer: Self-pay | Admitting: Internal Medicine

## 2023-11-30 ENCOUNTER — Ambulatory Visit
Admission: RE | Admit: 2023-11-30 | Discharge: 2023-11-30 | Disposition: A | Discharge status: Home / Self Care | Source: Ambulatory Visit | Attending: Internal Medicine | Admitting: Internal Medicine

## 2023-11-30 DIAGNOSIS — R160 Hepatomegaly, not elsewhere classified: Secondary | ICD-10-CM | POA: Diagnosis not present

## 2023-11-30 DIAGNOSIS — K769 Liver disease, unspecified: Secondary | ICD-10-CM | POA: Diagnosis not present

## 2023-11-30 DIAGNOSIS — R748 Abnormal levels of other serum enzymes: Secondary | ICD-10-CM | POA: Diagnosis not present

## 2023-11-30 DIAGNOSIS — E669 Obesity, unspecified: Secondary | ICD-10-CM | POA: Diagnosis not present

## 2023-11-30 DIAGNOSIS — R7401 Elevation of levels of liver transaminase levels: Secondary | ICD-10-CM | POA: Diagnosis not present

## 2023-11-30 DIAGNOSIS — R932 Abnormal findings on diagnostic imaging of liver and biliary tract: Secondary | ICD-10-CM | POA: Diagnosis not present

## 2023-12-07 NOTE — Addendum Note (Signed)
 Addended by: MARYLYNN VERNEITA CROME on: 12/07/2023 01:14 PM   Modules accepted: Orders

## 2023-12-15 ENCOUNTER — Ambulatory Visit
Admission: RE | Admit: 2023-12-15 | Discharge: 2023-12-15 | Disposition: A | Discharge status: Home / Self Care | Source: Ambulatory Visit | Attending: Internal Medicine | Admitting: Internal Medicine

## 2023-12-15 DIAGNOSIS — R160 Hepatomegaly, not elsewhere classified: Secondary | ICD-10-CM | POA: Diagnosis not present

## 2023-12-15 DIAGNOSIS — R932 Abnormal findings on diagnostic imaging of liver and biliary tract: Secondary | ICD-10-CM | POA: Diagnosis not present

## 2023-12-15 DIAGNOSIS — K769 Liver disease, unspecified: Secondary | ICD-10-CM | POA: Diagnosis not present

## 2023-12-15 DIAGNOSIS — D1803 Hemangioma of intra-abdominal structures: Secondary | ICD-10-CM | POA: Diagnosis not present

## 2023-12-15 MED ORDER — GADOBUTROL 1 MMOL/ML IV SOLN
7.0000 mL | Freq: Once | INTRAVENOUS | Status: AC | PRN
  Administered 2023-12-15: 7 mL via INTRAVENOUS

## 2024-01-28 DIAGNOSIS — M170 Bilateral primary osteoarthritis of knee: Secondary | ICD-10-CM | POA: Diagnosis not present

## 2024-02-16 ENCOUNTER — Encounter: Payer: Self-pay | Admitting: Dermatology

## 2024-02-16 ENCOUNTER — Ambulatory Visit: Payer: BC Managed Care – PPO | Admitting: Dermatology

## 2024-02-16 DIAGNOSIS — L814 Other melanin hyperpigmentation: Secondary | ICD-10-CM | POA: Diagnosis not present

## 2024-02-16 DIAGNOSIS — D492 Neoplasm of unspecified behavior of bone, soft tissue, and skin: Secondary | ICD-10-CM

## 2024-02-16 DIAGNOSIS — D229 Melanocytic nevi, unspecified: Secondary | ICD-10-CM

## 2024-02-16 DIAGNOSIS — L578 Other skin changes due to chronic exposure to nonionizing radiation: Secondary | ICD-10-CM

## 2024-02-16 DIAGNOSIS — D0461 Carcinoma in situ of skin of right upper limb, including shoulder: Secondary | ICD-10-CM

## 2024-02-16 DIAGNOSIS — L820 Inflamed seborrheic keratosis: Secondary | ICD-10-CM

## 2024-02-16 DIAGNOSIS — L720 Epidermal cyst: Secondary | ICD-10-CM

## 2024-02-16 DIAGNOSIS — Z1283 Encounter for screening for malignant neoplasm of skin: Secondary | ICD-10-CM | POA: Diagnosis not present

## 2024-02-16 DIAGNOSIS — W908XXA Exposure to other nonionizing radiation, initial encounter: Secondary | ICD-10-CM | POA: Diagnosis not present

## 2024-02-16 DIAGNOSIS — L821 Other seborrheic keratosis: Secondary | ICD-10-CM | POA: Diagnosis not present

## 2024-02-16 DIAGNOSIS — Z8589 Personal history of malignant neoplasm of other organs and systems: Secondary | ICD-10-CM

## 2024-02-16 DIAGNOSIS — Z86018 Personal history of other benign neoplasm: Secondary | ICD-10-CM

## 2024-02-16 DIAGNOSIS — D1801 Hemangioma of skin and subcutaneous tissue: Secondary | ICD-10-CM

## 2024-02-16 NOTE — Progress Notes (Signed)
 Follow-Up Visit   Subjective  Christine Monroe is a 48 y.o. female who presents for the following: Skin Cancer Screening and Full Body Skin Exam. HxSCCis, HxDN.  The patient presents for Total-Body Skin Exam (TBSE) for skin cancer screening and mole check. The patient has spots, moles and lesions to be evaluated, some may be new or changing and the patient may have concern these could be cancer.    The following portions of the chart were reviewed this encounter and updated as appropriate: medications, allergies, medical history  Review of Systems:  No other skin or systemic complaints except as noted in HPI or Assessment and Plan.  Objective  Well appearing patient in no apparent distress; mood and affect are within normal limits.  A full examination was performed including scalp, head, eyes, ears, nose, lips, neck, chest, axillae, abdomen, back, buttocks, bilateral upper extremities, bilateral lower extremities, hands, feet, fingers, toes, fingernails, and toenails. All findings within normal limits unless otherwise noted below.   Relevant physical exam findings are noted in the Assessment and Plan.  Exam of nails limited by presence of nail polish.   L lat clavicle x1, forehead x2 (3) Erythematous keratotic or waxy stuck-on papule or plaque. Right Anterior Neck 5 mm pink papule   Assessment & Plan   SKIN CANCER SCREENING PERFORMED TODAY.  SQUAMOUS CELL CARCINOMA IN SITU OF THE SKIN. R forearm dorsum. - Residual lesion surrounding scar - Repeat 5FU/Calcipotriene treatment twice a day for 2 weeks    HISTORY OF DYSPLASTIC NEVUS No evidence of recurrence today Recommend regular full body skin exams Recommend daily broad spectrum sunscreen SPF 30+ to sun-exposed areas, reapply every 2 hours as needed.  Call if any new or changing lesions are noted between office visits   ACTINIC DAMAGE - Chronic condition, secondary to cumulative UV/sun exposure - diffuse scaly  erythematous macules with underlying dyspigmentation - Recommend daily broad spectrum sunscreen SPF 30+ to sun-exposed areas, reapply every 2 hours as needed.  - Staying in the shade or wearing long sleeves, sun glasses (UVA+UVB protection) and wide brim hats (4-inch brim around the entire circumference of the hat) are also recommended for sun protection.  - Call for new or changing lesions.  LENTIGINES, SEBORRHEIC KERATOSES, HEMANGIOMAS - Benign normal skin lesions - Benign-appearing - Call for any changes  MELANOCYTIC NEVI - Tan-brown and/or pink-flesh-colored symmetric macules and papules - Benign appearing on exam today - Observation - Call clinic for new or changing moles - Recommend daily use of broad spectrum spf 30+ sunscreen to sun-exposed areas.  Check nails when remove polish.      INFLAMED SEBORRHEIC KERATOSIS (3) L lat clavicle x1, forehead x2 (3) Symptomatic, irritating, patient would like treated. Destruction of lesion - L lat clavicle x1, forehead x2 (3) Complexity: simple   Destruction method: cryotherapy   Informed consent: discussed and consent obtained   Timeout:  patient name, date of birth, surgical site, and procedure verified Lesion destroyed using liquid nitrogen: Yes   Region frozen until ice ball extended beyond lesion: Yes   Outcome: patient tolerated procedure well with no complications   Post-procedure details: wound care instructions given   Additional details:  Prior to procedure, discussed risks of blister formation, small wound, skin dyspigmentation, or rare scar following cryotherapy. Recommend Vaseline ointment to treated areas while healing.   NEOPLASM OF SKIN Right Anterior Neck Epidermal / dermal shaving  Lesion diameter (cm):  0.5 Informed consent: discussed and consent obtained   Timeout:  patient name, date of birth, surgical site, and procedure verified   Procedure prep:  Patient was prepped and draped in usual sterile fashion Prep  type:  Isopropyl alcohol Anesthesia: the lesion was anesthetized in a standard fashion   Anesthetic:  1% lidocaine  w/ epinephrine 1-100,000 buffered w/ 8.4% NaHCO3 Instrument used: flexible razor blade   Hemostasis achieved with: pressure, aluminum chloride and electrodesiccation   Outcome: patient tolerated procedure well   Post-procedure details: sterile dressing applied and wound care instructions given   Dressing type: bandage and petrolatum    Specimen 1 - Surgical pathology Differential Diagnosis: cyst vs irritated nevus  Check Margins: yes Return in about 1 year (around 02/15/2025) for TBSE, HxSCC, HxDN; 6 month SCC recheck.  Christine Monroe, Christine Monroe, CMA, am acting as scribe for Alm Rhyme, MD.   Documentation: Christine Monroe have reviewed the above documentation for accuracy and completeness, and Christine Monroe agree with the above.  Alm Rhyme, MD

## 2024-02-16 NOTE — Patient Instructions (Addendum)
 Repeat 5FU/Calcipotriene treatment twice a day for 2 weeks to right forearm.   Cryotherapy Aftercare  Wash gently with soap and water everyday.   Apply Vaseline and Band-Aid daily until healed.     Wound Care Instructions  Cleanse wound gently with soap and water once a day then pat dry with clean gauze. Apply a thin coat of Petrolatum (petroleum jelly, Vaseline) over the wound (unless you have an allergy to this). We recommend that you use a new, sterile tube of Vaseline. Do not pick or remove scabs. Do not remove the yellow or white healing tissue from the base of the wound.  Cover the wound with fresh, clean, nonstick gauze and secure with paper tape. You may use Band-Aids in place of gauze and tape if the wound is small enough, but would recommend trimming much of the tape off as there is often too much. Sometimes Band-Aids can irritate the skin.  You should call the office for your biopsy report after 1 week if you have not already been contacted.  If you experience any problems, such as abnormal amounts of bleeding, swelling, significant bruising, significant pain, or evidence of infection, please call the office immediately.  FOR ADULT SURGERY PATIENTS: If you need something for pain relief you may take 1 extra strength Tylenol (acetaminophen) AND 2 Ibuprofen  (200mg  each) together every 4 hours as needed for pain. (do not take these if you are allergic to them or if you have a reason you should not take them.) Typically, you may only need pain medication for 1 to 3 days.        Recommend daily broad spectrum sunscreen SPF 30+ to sun-exposed areas, reapply every 2 hours as needed. Call for new or changing lesions.  Staying in the shade or wearing long sleeves, sun glasses (UVA+UVB protection) and wide brim hats (4-inch brim around the entire circumference of the hat) are also recommended for sun protection.      Melanoma ABCDEs  Melanoma is the most dangerous type of skin  cancer, and is the leading cause of death from skin disease.  You are more likely to develop melanoma if you: Have light-colored skin, light-colored eyes, or red or blond hair Spend a lot of time in the sun Tan regularly, either outdoors or in a tanning bed Have had blistering sunburns, especially during childhood Have a close family member who has had a melanoma Have atypical moles or large birthmarks  Early detection of melanoma is key since treatment is typically straightforward and cure rates are extremely high if we catch it early.   The first sign of melanoma is often a change in a mole or a new dark spot.  The ABCDE system is a way of remembering the signs of melanoma.  A for asymmetry:  The two halves do not match. B for border:  The edges of the growth are irregular. C for color:  A mixture of colors are present instead of an even brown color. D for diameter:  Melanomas are usually (but not always) greater than 6mm - the size of a pencil eraser. E for evolution:  The spot keeps changing in size, shape, and color.  Please check your skin once per month between visits. You can use a small mirror in front and a large mirror behind you to keep an eye on the back side or your body.   If you see any new or changing lesions before your next follow-up, please call to schedule a  visit.  Please continue daily skin protection including broad spectrum sunscreen SPF 30+ to sun-exposed areas, reapplying every 2 hours as needed when you're outdoors.   Staying in the shade or wearing long sleeves, sun glasses (UVA+UVB protection) and wide brim hats (4-inch brim around the entire circumference of the hat) are also recommended for sun protection.       Due to recent changes in healthcare laws, you may see results of your pathology and/or laboratory studies on MyChart before the doctors have had a chance to review them. We understand that in some cases there may be results that are confusing or  concerning to you. Please understand that not all results are received at the same time and often the doctors may need to interpret multiple results in order to provide you with the best plan of care or course of treatment. Therefore, we ask that you please give us  2 business days to thoroughly review all your results before contacting the office for clarification. Should we see a critical lab result, you will be contacted sooner.   If You Need Anything After Your Visit  If you have any questions or concerns for your doctor, please call our main line at [phone] and press option 4 to reach your doctor's medical assistant. If no one answers, please leave a voicemail as directed and we will return your call as soon as possible. Messages left after 4 pm will be answered the following business day.   You may also send us  a message via MyChart. We typically respond to MyChart messages within 1-2 business days.  For prescription refills, please ask your pharmacy to contact our office. Our fax number is [phone].  If you have an urgent issue when the clinic is closed that cannot wait until the next business day, you can page your doctor at the number below.    Please note that while we do our best to be available for urgent issues outside of office hours, we are not available 24/7.   If you have an urgent issue and are unable to reach us , you may choose to seek medical care at your doctor's office, retail clinic, urgent care center, or emergency room.  If you have a medical emergency, please immediately call 911 or go to the emergency department.  Pager Numbers  - Dr. Hester: [phone]  - Dr. Jackquline: [phone]  - Dr. Claudene: [phone]   - Dr. Raymund: [phone]  In the event of inclement weather, please call our main line at [phone] for an update on the status of any delays or closures.  Dermatology Medication Tips: Please keep the boxes that topical medications come  in in order to help keep track of the instructions about where and how to use these. Pharmacies typically print the medication instructions only on the boxes and not directly on the medication tubes.   If your medication is too expensive, please contact our office at [phone] option 4 or send us  a message through MyChart.   We are unable to tell what your co-pay for medications will be in advance as this is different depending on your insurance coverage. However, we may be able to find a substitute medication at lower cost or fill out paperwork to get insurance to cover a needed medication.   If a prior authorization is required to get your medication covered by your insurance company, please allow us  1-2 business days to complete this process.  Drug prices often vary depending on where the  prescription is filled and some pharmacies may offer cheaper prices.  The website www.goodrx.com contains coupons for medications through different pharmacies. The prices here do not account for what the cost may be with help from insurance (it may be cheaper with your insurance), but the website can give you the price if you did not use any insurance.  - You can print the associated coupon and take it with your prescription to the pharmacy.  - You may also stop by our office during regular business hours and pick up a GoodRx coupon card.  - If you need your prescription sent electronically to a different pharmacy, notify our office through [facility] or by phone at [phone] option 4.     Si Usted Necesita Algo Despus de Su Visita  Tambin puede enviarnos un mensaje a travs de Clinical Cytogeneticist. Por lo general respondemos a los mensajes de MyChart en el transcurso de 1 a 2 das hbiles.  Para renovar recetas, por favor pida a su farmacia que se ponga en contacto con nuestra oficina. Randi lakes de fax es [place] [phone].  Si tiene un asunto urgente cuando la clnica est cerrada y que no puede  esperar hasta el siguiente da hbil, puede llamar/localizar a su doctor(a) al nmero que aparece a continuacin.   Por favor, tenga en cuenta que aunque hacemos todo lo posible para estar disponibles para asuntos urgentes fuera del horario de [place], no estamos disponibles las 24 horas del da, los 7 [address]  [address] de la [place].   Si tiene un problema urgente y no puede comunicarse con nosotros, puede optar por buscar atencin mdica  en el consultorio de su doctor(a), en una clnica privada, en un centro de atencin urgente o en una sala de emergencias.  Si tiene engineer, drilling, por favor llame inmediatamente al 911 o vaya a la sala de emergencias.  Nmeros de bper  - Dr. Hester: [phone]  - Dra. Jackquline: [phone]  - Dr. Claudene: [phone]  - Dra. Kitts: [phone]  En caso de inclemencias del [place], por favor llame a nuestra lnea principal al [phone] para una actualizacin sobre el estado de cualquier retraso o cierre.  Consejos para la medicacin en dermatologa: Por favor, guarde las cajas en las que vienen los medicamentos de uso tpico para ayudarle a seguir las instrucciones sobre dnde y cmo usarlos. Las farmacias generalmente imprimen las instrucciones del medicamento slo en las cajas y no directamente en los tubos del [place].   Si su medicamento es muy caro, por favor, pngase en contacto con landry rieger llamando al [phone] y presione la opcin 4 o envenos un mensaje a travs de Clinical Cytogeneticist.   No podemos decirle cul ser su copago por los medicamentos por adelantado ya que esto es diferente dependiendo de la cobertura de su seguro. Sin embargo, es posible que podamos encontrar un medicamento sustituto a audiological scientist un formulario para que el seguro cubra el medicamento que se considera necesario.   Si se requiere una autorizacin previa para que su compaa de seguros cubra su medicamento, por favor permtanos de 1 a 2 das hbiles para  completar este proceso.  Los precios de los medicamentos varan con frecuencia dependiendo del environmental consultant de dnde se surte la receta y alguna farmacias pueden ofrecer precios ms baratos.  El sitio web www.goodrx.com tiene cupones para medicamentos de health and safety inspector. Los precios aqu no tienen en cuenta lo que podra costar con la ayuda del seguro (puede ser ms barato con su  seguro), pero el sitio web puede darle el precio si no visual merchandiser.  - Puede imprimir el cupn correspondiente y llevarlo con su receta a la farmacia.  - Tambin puede pasar por nuestra oficina durante el horario de atencin regular y education officer, museum una tarjeta de cupones de GoodRx.  - Si necesita que su receta se enve electrnicamente a una farmacia diferente, informe a nuestra oficina a travs de MyChart de [place] o por telfono llamando al [phone] y presione la opcin 4.

## 2024-02-21 ENCOUNTER — Ambulatory Visit: Payer: Self-pay | Admitting: Dermatology

## 2024-02-21 LAB — SURGICAL PATHOLOGY

## 2024-02-22 ENCOUNTER — Encounter: Payer: Self-pay | Admitting: Dermatology

## 2024-02-22 NOTE — Telephone Encounter (Signed)
 Advised pt of bx result/sh ?

## 2024-02-22 NOTE — Telephone Encounter (Signed)
-----   Message from Alm Rhyme sent at 02/21/2024  5:48 PM EST ----- FINAL DIAGNOSIS        1. Skin, right anterior neck :       CONSISTENT WITH SURFACE OF AN EPIDERMOID CYST   Consistent with Cyst Benign May persist or recur ----- Message ----- From: Interface, Lab In [place] Sent: 02/21/2024   4:26 PM EST To: Alm JAYSON Rhyme, MD

## 2024-02-24 DIAGNOSIS — Z1231 Encounter for screening mammogram for malignant neoplasm of breast: Secondary | ICD-10-CM | POA: Diagnosis not present

## 2024-02-24 LAB — HM MAMMOGRAPHY

## 2024-02-28 DIAGNOSIS — Z0189 Encounter for other specified special examinations: Secondary | ICD-10-CM | POA: Diagnosis not present

## 2024-03-22 DIAGNOSIS — M1711 Unilateral primary osteoarthritis, right knee: Secondary | ICD-10-CM | POA: Diagnosis not present

## 2024-04-19 ENCOUNTER — Encounter: Payer: Self-pay | Admitting: Internal Medicine

## 2024-04-19 ENCOUNTER — Ambulatory Visit: Admitting: Internal Medicine

## 2024-04-19 VITALS — BP 120/84 | HR 96 | Ht 66.0 in | Wt 176.4 lb

## 2024-04-19 DIAGNOSIS — Z96651 Presence of right artificial knee joint: Secondary | ICD-10-CM

## 2024-04-19 DIAGNOSIS — I10 Essential (primary) hypertension: Secondary | ICD-10-CM

## 2024-04-19 DIAGNOSIS — R748 Abnormal levels of other serum enzymes: Secondary | ICD-10-CM | POA: Diagnosis not present

## 2024-04-19 DIAGNOSIS — Z6828 Body mass index (BMI) 28.0-28.9, adult: Secondary | ICD-10-CM

## 2024-04-19 DIAGNOSIS — G8929 Other chronic pain: Secondary | ICD-10-CM

## 2024-04-19 DIAGNOSIS — E669 Obesity, unspecified: Secondary | ICD-10-CM

## 2024-04-19 DIAGNOSIS — M25561 Pain in right knee: Secondary | ICD-10-CM

## 2024-04-19 DIAGNOSIS — F411 Generalized anxiety disorder: Secondary | ICD-10-CM | POA: Diagnosis not present

## 2024-04-19 DIAGNOSIS — Z114 Encounter for screening for human immunodeficiency virus [HIV]: Secondary | ICD-10-CM

## 2024-04-19 LAB — BASIC METABOLIC PANEL WITH GFR
BUN: 19 mg/dL (ref 6–23)
CO2: 30 meq/L (ref 19–32)
Calcium: 9.3 mg/dL (ref 8.4–10.5)
Chloride: 100 meq/L (ref 96–112)
Creatinine, Ser: 0.81 mg/dL (ref 0.40–1.20)
GFR: 84.47 mL/min
Glucose, Bld: 88 mg/dL (ref 70–99)
Potassium: 3.7 meq/L (ref 3.5–5.1)
Sodium: 139 meq/L (ref 135–145)

## 2024-04-19 LAB — CBC WITH DIFFERENTIAL/PLATELET
Basophils Absolute: 0.1 K/uL (ref 0.0–0.1)
Basophils Relative: 1.9 % (ref 0.0–3.0)
Eosinophils Absolute: 0.2 K/uL (ref 0.0–0.7)
Eosinophils Relative: 2.5 % (ref 0.0–5.0)
HCT: 41.9 % (ref 36.0–46.0)
Hemoglobin: 14.1 g/dL (ref 12.0–15.0)
Lymphocytes Relative: 28.9 % (ref 12.0–46.0)
Lymphs Abs: 2 K/uL (ref 0.7–4.0)
MCHC: 33.7 g/dL (ref 30.0–36.0)
MCV: 85.5 fl (ref 78.0–100.0)
Monocytes Absolute: 0.4 K/uL (ref 0.1–1.0)
Monocytes Relative: 5.3 % (ref 3.0–12.0)
Neutro Abs: 4.3 K/uL (ref 1.4–7.7)
Neutrophils Relative %: 61.4 % (ref 43.0–77.0)
Platelets: 411 K/uL — ABNORMAL HIGH (ref 150.0–400.0)
RBC: 4.9 Mil/uL (ref 3.87–5.11)
RDW: 13.4 % (ref 11.5–15.5)
WBC: 6.9 K/uL (ref 4.0–10.5)

## 2024-04-19 LAB — IBC + FERRITIN
Ferritin: 170.6 ng/mL (ref 10.0–291.0)
Iron: 61 ug/dL (ref 42–145)
Saturation Ratios: 17 % — ABNORMAL LOW (ref 20.0–50.0)
TIBC: 359.8 ug/dL (ref 250.0–450.0)
Transferrin: 257 mg/dL (ref 212.0–360.0)

## 2024-04-19 LAB — LDL CHOLESTEROL, DIRECT: Direct LDL: 141 mg/dL

## 2024-04-19 LAB — LIPID PANEL
Cholesterol: 218 mg/dL — ABNORMAL HIGH (ref 28–200)
HDL: 59.7 mg/dL
LDL Cholesterol: 133 mg/dL — ABNORMAL HIGH (ref 10–99)
NonHDL: 158.2
Total CHOL/HDL Ratio: 4
Triglycerides: 125 mg/dL (ref 10.0–149.0)
VLDL: 25 mg/dL (ref 0.0–40.0)

## 2024-04-19 LAB — TSH: TSH: 1.27 u[IU]/mL (ref 0.35–5.50)

## 2024-04-19 LAB — MICROALBUMIN / CREATININE URINE RATIO
Creatinine,U: 188.4 mg/dL
Microalb Creat Ratio: 8.7 mg/g (ref 0.0–30.0)
Microalb, Ur: 1.6 mg/dL (ref 0.7–1.9)

## 2024-04-19 MED ORDER — HYDROCHLOROTHIAZIDE 25 MG PO TABS: 25.0000 mg | ORAL_TABLET | Freq: Every day | ORAL | 3 refills | Status: AC

## 2024-04-19 MED ORDER — PAROXETINE HCL 20 MG PO TABS: 20.0000 mg | ORAL_TABLET | Freq: Every day | ORAL | 1 refills | Status: AC

## 2024-04-19 NOTE — Patient Instructions (Signed)
 I'm glad your knee surgery went well   Your blood pressure is NEAR GOAL OF 130/80 .  Please check your blood pressure  ONCE A MONTH  at home and send me the readings  I A FEW MONTHS  so I can determine if you need to start an additional  medication to lower your blood pressure .

## 2024-04-19 NOTE — Progress Notes (Signed)
 "  Subjective:  Patient ID: Christine Monroe, female    DOB: [birth year]  Age: 49 y.o. MRN: [id number]  CC: The primary encounter diagnosis was Screening for HIV (human immunodeficiency virus). Diagnoses of Essential hypertension, Elevated liver enzymes, S/P total knee arthroplasty, right, Generalized anxiety disorder, Chronic pain of right knee, and Obesity (BMI 30-39.9) were also pertinent to this visit.   HPI Christine Monroe presents for  Chief Complaint  Patient presents with   Medical Management of Chronic Issues    6 month follow up    Follow up on hypertension and GAD  Hypertension: patient does not  check blood pressure  at home.  . Patient is following a reduced salt diet most days and is taking medications as prescribed (amlodipne and hydrochlorothiazide )  GAD:  taking paxil  daily,  using alprazolam  prn insomnia  not nightly.  Does not need refill. Refill history confirmed via [place] Controlled Substance databas, accessed by me today..   S/p right knee replacement 4 weeks ago,  doing well,  off of opioids for pain mgmt  ROM currently at 110 degrees of extension  with PT.  Still on FMLA.,  works from home   Elevated ALT: ultrasound and MRI reviewed.  No signs of fatty liver.  Autoimmune screening to be done today    Outpatient Medications Prior to Visit  Medication Sig Dispense Refill   ALPRAZolam  (XANAX ) 0.25 MG tablet Take 1 tablet (0.25 mg total) by mouth at bedtime as needed for sleep. 30 tablet 5   amLODipine  (NORVASC ) 5 MG tablet TAKE 1 TABLET (5 MG TOTAL) BY MOUTH DAILY. 90 tablet 1   aspirin EC 81 MG tablet Take 81 mg by mouth in the morning and at bedtime.     pantoprazole  (PROTONIX ) 40 MG tablet TAKE 1 TABLET BY MOUTH EVERY DAY 30 tablet 3   valACYclovir  (VALTREX ) 1000 MG tablet Start Valacyclovir  1 gm take 2 tablets at first sign of flare repeat in 12 hours, or take 1 tablet twice a day for 5 days 30 tablet 11   hydrochlorothiazide  (HYDRODIURIL ) 25 MG tablet Take 1 tablet (25  mg total) by mouth daily. 90 tablet 3   PARoxetine  (PAXIL ) 20 MG tablet TAKE 1 TABLET BY MOUTH EVERY DAY 90 tablet 1   methocarbamol (ROBAXIN) 500 MG tablet Take 500 mg by mouth 2 (two) times daily as needed. (Patient not taking: Reported on 04/19/2024)     Semaglutide -Weight Management (WEGOVY ) 2.4 MG/0.75ML SOAJ Inject 2.4 mg into the skin once a week. (Patient not taking: Reported on 04/19/2024) 3 mL 11   No facility-administered medications prior to visit.    Review of Systems;  Patient denies headache, fevers, malaise, unintentional weight loss, skin rash, eye pain, sinus congestion and sinus pain, sore throat, dysphagia,  hemoptysis , cough, dyspnea, wheezing, chest pain, palpitations, orthopnea, edema, abdominal pain, nausea, melena, diarrhea, constipation, flank pain, dysuria, hematuria, urinary  Frequency, nocturia, numbness, tingling, seizures,  Focal weakness, Loss of consciousness,  Tremor, insomnia, depression, anxiety, and suicidal ideation.      Objective:  BP 120/84   Pulse 96   Ht 5' 6 (1.676 m)   Wt 176 lb 6.4 oz (80 kg)   SpO2 97%   BMI 28.47 kg/m   BP Readings from Last 3 Encounters:  04/19/24 120/84  10/05/23 128/76  07/02/23 (!) 148/100    Wt Readings from Last 3 Encounters:  04/19/24 176 lb 6.4 oz (80 kg)  10/05/23 174 lb 3.2 oz (79 kg)  07/02/23 170 lb 12.8 oz (77.5 kg)    Physical Exam Vitals reviewed.  Constitutional:      General: She is not in acute distress.    Appearance: Normal appearance. She is normal weight. She is not ill-appearing, toxic-appearing or diaphoretic.  HENT:     Head: Normocephalic.  Eyes:     General: No scleral icterus.       Right eye: No discharge.        Left eye: No discharge.     Conjunctiva/sclera: Conjunctivae normal.  Cardiovascular:     Rate and Rhythm: Normal rate and regular rhythm.     Heart sounds: Normal heart sounds.  Pulmonary:     Effort: Pulmonary effort is normal. No respiratory distress.     Breath  sounds: Normal breath sounds.  Musculoskeletal:        General: Swelling present. Normal range of motion.     Right knee: Swelling present. No effusion or erythema. Normal range of motion.     Left knee: Normal.       Legs:     Comments: Right knee diffusely swollen ,  no erythema.   Skin:    General: Skin is warm and dry.  Neurological:     General: No focal deficit present.     Mental Status: She is alert and oriented to person, place, and time. Mental status is at baseline.  Psychiatric:        Mood and Affect: Mood normal.        Behavior: Behavior normal.        Thought Content: Thought content normal.        Judgment: Judgment normal.     Lab Results  Component Value Date   HGBA1C 5.0 03/03/2023   HGBA1C 5.2 06/11/2020    Lab Results  Component Value Date   CREATININE 0.81 04/19/2024   CREATININE 0.84 11/22/2023   CREATININE 0.69 05/17/2023    Lab Results  Component Value Date   WBC 6.9 04/19/2024   HGB 14.1 04/19/2024   HCT 41.9 04/19/2024   PLT 411.0 (H) 04/19/2024   GLUCOSE 88 04/19/2024   CHOL 218 (H) 04/19/2024   TRIG 125.0 04/19/2024   HDL 59.70 04/19/2024   LDLDIRECT 141.0 04/19/2024   LDLCALC 133 (H) 04/19/2024   ALT 51 (H) 11/22/2023   AST 28 11/22/2023   NA 139 04/19/2024   K 3.7 04/19/2024   CL 100 04/19/2024   CREATININE 0.81 04/19/2024   BUN 19 04/19/2024   CO2 30 04/19/2024   TSH 1.27 04/19/2024   HGBA1C 5.0 03/03/2023   MICROALBUR 1.6 04/19/2024    MR Abdomen W Wo Contrast Result Date: 12/17/2023 CLINICAL DATA:  Elevated liver function tests. Small hepatic masses seen on recent ultrasound. EXAM: MRI ABDOMEN WITHOUT AND WITH CONTRAST TECHNIQUE: Multiplanar multisequence MR imaging of the abdomen was performed both before and after the administration of intravenous contrast. CONTRAST:  7mL GADAVIST  GADOBUTROL  1 MMOL/ML IV SOLN COMPARISON:  Ultrasound on 11/30/2023 FINDINGS: Lower chest: No acute findings. Hepatobiliary: At least 4 small  lesions are seen in the right hepatic lobe measuring up to 1 cm in size. All of these show marked T2 hyperintensity and nodular peripheral enhancement pattern consistent with benign hemangiomas. These correspond to the liver lesions seen on ultrasound. Another benign hemangioma is seen in segment 2 of the left lobe measuring 1.9 cm. Prior cholecystectomy. No evidence of biliary obstruction. Pancreas:  No mass or inflammatory changes. Spleen:  Within normal limits  in size and appearance. Adrenals/Urinary Tract: No suspicious masses identified. No evidence of hydronephrosis. Stomach/Bowel: Unremarkable. Vascular/Lymphatic: No pathologically enlarged lymph nodes identified. No acute vascular findings. Other:  None. Musculoskeletal:  No suspicious bone lesions identified. IMPRESSION: Several small benign hepatic hemangiomas, which correspond to the liver lesions seen on recent ultrasound. No evidence of malignancy or other significant abnormality. Electronically Signed   By: Norleen DELENA Kil M.D.   On: 12/17/2023 09:51    Assessment & Plan:  .Screening for HIV (human immunodeficiency virus) -     HIV Antibody (routine testing w rflx)  Essential hypertension Assessment & Plan: Controlled on current regimen of 5 MG AMLODIPINE , HCTZ  Orders: -     hydroCHLOROthiazide ; Take 1 tablet (25 mg total) by mouth daily.  Dispense: 90 tablet; Refill: 3 -     Lipid panel -     LDL cholesterol, direct -     Microalbumin / creatinine urine ratio -     Basic metabolic panel with GFR  Elevated liver enzymes Assessment & Plan: No evidence of hepatic steatosis by imaging.  Autoimmune serologies are pending    Lab Results  Component Value Date   ALT 51 (H) 11/22/2023   AST 28 11/22/2023   ALKPHOS 86 11/22/2023   BILITOT 0.4 11/22/2023     Orders: -     TSH -     IBC + Ferritin -     AntiMicrosomal Ab-Liver / Kidney -     Alpha-1-antitrypsin -     ANA -     Anti-Smith antibody -     Anti-smooth muscle  antibody, IgG -     Ceruloplasmin -     Hepatitis B surface antibody,qualitative -     Hepatitis C antibody -     Mitochondrial antibodies  S/P total knee arthroplasty, right -     CBC with Differential/Platelet  Generalized anxiety disorder Assessment & Plan: With panic attacks dramatilally reduced  in frequency since starting daily paxil .  Continue  prn alprazolam  prn and daily paxil . No changes today . The risks and benefits of benzodiazepine use were discussed with patient today including excessive sedation leading to respiratory depression,  impaired thinking/driving, and addiction.  Patient was advised to avoid concurrent use with alcohol, to use medication only as needed and not to share with others  .    Chronic pain of right knee Assessment & Plan: S/p TKR after failure to improve with PT and  3rd gel injection.  Doing well 4 weeks post op   Obesity (BMI 30-39.9) Assessment & Plan: She achieved a weight loss of 41 lbs using wegovy ,  but is no longer taking it due to cost .weight regain has been minimal (6 lbs)   Encouraged to modify diet and increase physical activity as tolerated post knee replacement    Other orders -     PARoxetine  HCl; Take 1 tablet (20 mg total) by mouth daily.  Dispense: 90 tablet; Refill: 1    Follow-up: Return in about 6 months (around 10/17/2024) for physical, hypertension.   Verneita LITTIE Kettering, MD "

## 2024-04-20 LAB — ANTI-SMITH ANTIBODY: ENA SM Ab Ser-aCnc: <0.2 AI (ref 0.0–0.9)

## 2024-04-21 ENCOUNTER — Ambulatory Visit

## 2024-04-21 DIAGNOSIS — R748 Abnormal levels of other serum enzymes: Secondary | ICD-10-CM | POA: Diagnosis not present

## 2024-04-21 LAB — HEPATIC FUNCTION PANEL
ALT: 14 U/L (ref 3–35)
AST: 15 U/L (ref 5–37)
Albumin: 4.3 g/dL (ref 3.5–5.2)
Alkaline Phosphatase: 105 U/L (ref 39–117)
Bilirubin, Direct: 0.1 mg/dL (ref 0.1–0.3)
Total Bilirubin: 0.3 mg/dL (ref 0.2–1.2)
Total Protein: 7.5 g/dL (ref 6.0–8.3)

## 2024-04-21 NOTE — Assessment & Plan Note (Signed)
 Controlled on current regimen of 5 MG AMLODIPINE , HCTZ

## 2024-04-21 NOTE — Assessment & Plan Note (Signed)
 S/p TKR after failure to improve with PT and  3rd gel injection.  Doing well 4 weeks post op

## 2024-04-21 NOTE — Assessment & Plan Note (Addendum)
 She achieved a weight loss of 41 lbs using wegovy ,  but is no longer taking it due to cost .weight regain has been minimal (6 lbs)   Encouraged to modify diet and increase physical activity as tolerated post knee replacement

## 2024-04-21 NOTE — Assessment & Plan Note (Addendum)
 No evidence of hepatic steatosis by imaging.  Autoimmune serologies are pending    Lab Results  Component Value Date   ALT 51 (H) 11/22/2023   AST 28 11/22/2023   ALKPHOS 86 11/22/2023   BILITOT 0.4 11/22/2023

## 2024-04-21 NOTE — Assessment & Plan Note (Signed)
 With panic attacks dramatilally reduced  in frequency since starting daily paxil .  Continue  prn alprazolam  prn and daily paxil . No changes today . The risks and benefits of benzodiazepine use were discussed with patient today including excessive sedation leading to respiratory depression,  impaired thinking/driving, and addiction.  Patient was advised to avoid concurrent use with alcohol, to use medication only as needed and not to share with others  .

## 2024-04-22 LAB — ANTI-MICROSOMAL ANTIBODY LIVER / KIDNEY: LKM1 Ab: <=20 U

## 2024-04-22 LAB — ALPHA-1-ANTITRYPSIN: A-1 Antitrypsin, Ser: 207 mg/dL — ABNORMAL HIGH (ref 83–199)

## 2024-04-23 NOTE — Addendum Note (Signed)
 Addended by: MARYLYNN VERNEITA CROME on: 04/23/2024 09:33 PM   Modules accepted: Orders

## 2024-04-24 LAB — CERULOPLASMIN: Ceruloplasmin: 46.2 mg/dL — ABNORMAL HIGH (ref 19.0–39.0)

## 2024-04-24 LAB — HIV ANTIBODY (ROUTINE TESTING W REFLEX): HIV Screen 4th Generation wRfx: NONREACTIVE

## 2024-04-24 LAB — HEPATITIS B SURFACE ANTIBODY,QUALITATIVE: Hep B Surface Ab, Qual: NONREACTIVE

## 2024-04-24 LAB — ANA: Anti Nuclear Antibody (ANA): NEGATIVE

## 2024-04-24 LAB — MITOCHONDRIAL ANTIBODIES: Mitochondrial Ab: <20 U (ref 0.0–20.0)

## 2024-04-24 LAB — HEPATITIS C ANTIBODY: Hep C Virus Ab: NONREACTIVE

## 2024-04-24 LAB — ANTI-SMOOTH MUSCLE ANTIBODY, IGG: Smooth Muscle Ab: 4 U (ref 0–19)

## 2024-04-26 ENCOUNTER — Other Ambulatory Visit

## 2024-04-26 NOTE — Progress Notes (Signed)
 Noted

## 2024-04-28 ENCOUNTER — Other Ambulatory Visit

## 2024-04-28 DIAGNOSIS — R748 Abnormal levels of other serum enzymes: Secondary | ICD-10-CM

## 2024-05-03 LAB — COPPER, URINE, 24 HOUR
Copper,Urine (24 Hr): <5 ug/(24.h) — ABNORMAL LOW (ref 15–60)
Total Volume: 3000 mL

## 2024-05-04 ENCOUNTER — Encounter: Payer: Self-pay | Admitting: Internal Medicine

## 2024-05-04 ENCOUNTER — Ambulatory Visit: Payer: Self-pay | Admitting: Internal Medicine

## 2024-08-16 ENCOUNTER — Ambulatory Visit: Admitting: Dermatology

## 2024-10-25 ENCOUNTER — Ambulatory Visit: Admitting: Internal Medicine
